# Patient Record
Sex: Male | Born: 1955 | Race: Black or African American | Hispanic: No | Marital: Married | Smoking: Never smoker
Health system: Southern US, Community
[De-identification: ages and names within clinical notes are randomized; demographics above are authoritative.]

## PROBLEM LIST (undated history)

## (undated) DIAGNOSIS — I10 Essential (primary) hypertension: Secondary | ICD-10-CM

---

## 2018-11-04 ENCOUNTER — Telehealth: Payer: Self-pay | Admitting: Family Medicine

## 2018-11-04 ENCOUNTER — Other Ambulatory Visit: Payer: Self-pay

## 2018-11-04 ENCOUNTER — Emergency Department (INDEPENDENT_AMBULATORY_CARE_PROVIDER_SITE_OTHER): Payer: BC Managed Care – PPO

## 2018-11-04 ENCOUNTER — Emergency Department (INDEPENDENT_AMBULATORY_CARE_PROVIDER_SITE_OTHER)
Admission: EM | Admit: 2018-11-04 | Discharge: 2018-11-04 | Disposition: A | Discharge status: Home / Self Care | Payer: BC Managed Care – PPO | Source: Home / Self Care | Attending: Family Medicine | Admitting: Family Medicine

## 2018-11-04 DIAGNOSIS — R05 Cough: Secondary | ICD-10-CM

## 2018-11-04 DIAGNOSIS — R053 Chronic cough: Secondary | ICD-10-CM

## 2018-11-04 HISTORY — DX: Essential (primary) hypertension: I10

## 2018-11-04 MED ORDER — AZITHROMYCIN 250 MG PO TABS: ORAL_TABLET | ORAL | 0 refills | Status: DC

## 2018-11-04 MED ORDER — PREDNISONE 20 MG PO TABS: ORAL_TABLET | ORAL | 0 refills | Status: DC

## 2018-11-04 NOTE — ED Provider Notes (Signed)
[name]    CSN: [id number] Arrival date & time: 11/04/18  1305     History   Chief Complaint Chief Complaint  Patient presents with  . Cough  . Sore Throat    HPI Eric Hardin is a 63 y.o. male.   One month ago patient developed a URI with sore throat, nasal congestion, and cough.  He improved, but continued to have a cough.  One week ago he developed chills, and during the past week has had increased cough, fatigue, and occasional wheezing.  He feels tightness in his anterior chest. Patient had pneumonia about two years ago.  The history is provided by the patient.    Past Medical History:  Diagnosis Date  . Hypertension   Pneumonia  Active problem: hypertension    Past surgical history:  No entries by patient     Home Medications    Prior to Admission medications   Medication Sig Start Date End Date Taking? Authorizing Provider  acetaminophen (TYLENOL) 325 MG tablet Take 650 mg by mouth every 6 (six) hours as needed.   Yes [provider]  guaiFENesin (MUCINEX) 600 MG 12 hr tablet Take by mouth 2 (two) times daily.   Yes [provider]  hydrochlorothiazide (HYDRODIURIL) 12.5 MG tablet Take 12.5 mg by mouth daily. 08/27/18  Yes [provider]  ibuprofen (ADVIL,MOTRIN) 200 MG tablet Take 200 mg by mouth every 6 (six) hours as needed.   Yes [provider]  losartan (COZAAR) 100 MG tablet Take 100 mg by mouth daily. 06/26/18  Yes [provider]  azithromycin (ZITHROMAX Z-PAK) 250 MG tablet Take 2 tabs today; then begin one tab once daily for 4 more days. 11/04/18   Kandra Nicolas, MD  predniSONE (DELTASONE) 20 MG tablet Take one tab by mouth twice daily for 4 days, then one daily. Take with food. 11/04/18   Kandra Nicolas, MD    Family History Family History  Problem Relation Age of Onset  . Aneurysm Mother   . Hypertension Mother   . Cancer Father 26       Prostate    Social History Social  History   Tobacco Use  . Smoking status: Never Smoker  . Smokeless tobacco: Never Used  Substance Use Topics  . Alcohol use: Not Currently    Frequency: Never  . Drug use: Never     Allergies   Patient has no known allergies.   Review of Systems Review of Systems + sore throat + cough No pleuritic pain ? wheezing No nasal congestion + post-nasal drainage No sinus pain/pressure No itchy/red eyes No earache No hemoptysis No SOB No fever, + chills No nausea No vomiting No abdominal pain No diarrhea No urinary symptoms No skin rash + fatigue No myalgias No headache Used OTC meds without relief   Physical Exam Triage Vital Signs ED Triage Vitals [11/04/18 1357]  Enc Vitals Group     BP 117/78     Pulse Rate 86     Resp 18     Temp 98.2 F (36.8 C)     Temp Source Oral     SpO2 99 %     Weight 183 lb (83 kg)     Height 5\' 9"  (1.753 m)     Head Circumference      Peak Flow      Pain Score 0     Pain Loc      Pain Edu?  Excl. in [place]?    No data found.  Updated Vital Signs BP 117/78 (BP Location: Right Arm)   Pulse 86   Temp 98.2 F (36.8 C) (Oral)   Resp 18   Ht 5\' 9"  (1.753 m)   Wt 83 kg   SpO2 99%   BMI 27.02 kg/m   Visual Acuity Right Eye Distance:   Left Eye Distance:   Bilateral Distance:    Right Eye Near:   Left Eye Near:    Bilateral Near:     Physical Exam Nursing notes and Vital Signs reviewed. Appearance:  Patient appears stated age, and in no acute distress Eyes:  Pupils are equal, round, and reactive to light and accomodation.  Extraocular movement is intact.  Conjunctivae are not inflamed  Ears:  Canals normal.  Tympanic membranes normal.  Nose:  Mildly congested turbinates.  No sinus tenderness.   Pharynx:  Normal Neck:  Supple. No adenopathy. Lungs:  Clear to auscultation, but has a frequent course cough.  Breath sounds are equal.  Moving air well. Heart:  Regular rate and rhythm without murmurs, rubs, or gallops.   Abdomen:  Nontender without masses or hepatosplenomegaly.  Bowel sounds are present.  No CVA or flank tenderness.  Extremities:  No edema.  Skin:  No rash present.    UC Treatments / Results  Labs (all labs ordered are listed, but only abnormal results are displayed) Labs Reviewed - No data to display  EKG None  Radiology Dg Chest 2 View  Result Date: 11/04/2018 CLINICAL DATA:  Pt c/o productive cough x 1 month which has worsened over the past 5 days with diaphoresis. Hx of htn. EXAM: CHEST - 2 VIEW COMPARISON:  None. FINDINGS: The heart size and mediastinal contours are within normal limits. Both lungs are clear. No pleural effusion or pneumothorax. The visualized skeletal structures are unremarkable. IMPRESSION: No active cardiopulmonary disease. Electronically Signed   By: Lajean Manes M.D.   On: 11/04/2018 15:18    Procedures Procedures (including critical care time)  Medications Ordered in UC Medications - No data to display  Initial Impression / Assessment and Plan / UC Course  I have reviewed the triage vital signs and the nursing notes.  Pertinent labs & imaging results that were available during my care of the patient were reviewed by me and considered in my medical decision making (see chart for details).    Will treat as a bacterial bronchitis with Z-pak.  Begin prednisone burst/taper.  Followup with Family Doctor if not improved in about 8 days.   Final Clinical Impressions(s) / UC Diagnoses   Final diagnoses:  Persistent cough for 3 weeks or longer     Discharge Instructions     Take plain guaifenesin (1200mg  extended release tabs such as Mucinex) twice daily, with plenty of water, for cough and congestion.  Get adequate rest.   May take Delsym Cough Suppressant at bedtime for nighttime cough.  Try warm salt water gargles for sore throat.  Stop all antihistamines for now, and other non-prescription cough/cold preparations.      ED Prescriptions     Medication Sig Dispense Auth. Provider   azithromycin (ZITHROMAX Z-PAK) 250 MG tablet Take 2 tabs today; then begin one tab once daily for 4 more days. 6 tablet Kandra Nicolas, MD   predniSONE (DELTASONE) 20 MG tablet Take one tab by mouth twice daily for 4 days, then one daily. Take with food. 12 tablet Kandra Nicolas, MD  Kandra Nicolas, MD 11/05/18 [phone]

## 2018-11-04 NOTE — ED Triage Notes (Signed)
Eric Hardin complains of a productive cough with yellow sputum for a month. He recently has noticed a sore throat, diarrhea, chills and chest pain from coughing.

## 2018-11-04 NOTE — Discharge Instructions (Addendum)
Take plain guaifenesin (1200mg  extended release tabs such as Mucinex) twice daily, with plenty of water, for cough and congestion.  Get adequate rest.   May take Delsym Cough Suppressant at bedtime for nighttime cough.  Try warm salt water gargles for sore throat.  Stop all antihistamines for now, and other non-prescription cough/cold preparations.

## 2020-03-10 ENCOUNTER — Other Ambulatory Visit: Payer: Self-pay

## 2020-03-10 ENCOUNTER — Ambulatory Visit (INDEPENDENT_AMBULATORY_CARE_PROVIDER_SITE_OTHER): Payer: BC Managed Care – PPO

## 2020-03-10 ENCOUNTER — Encounter: Payer: Self-pay | Admitting: Family Medicine

## 2020-03-10 ENCOUNTER — Ambulatory Visit (INDEPENDENT_AMBULATORY_CARE_PROVIDER_SITE_OTHER): Payer: BC Managed Care – PPO | Admitting: Family Medicine

## 2020-03-10 ENCOUNTER — Ambulatory Visit: Payer: Self-pay

## 2020-03-10 VITALS — BP 120/78 | HR 69 | Ht 69.0 in | Wt 196.0 lb

## 2020-03-10 DIAGNOSIS — M25531 Pain in right wrist: Secondary | ICD-10-CM

## 2020-03-10 DIAGNOSIS — M25562 Pain in left knee: Secondary | ICD-10-CM

## 2020-03-10 NOTE — Progress Notes (Signed)
Subjective:    CC: L knee and R elbow pain  I, Molly Weber, LAT, ATC, am serving as scribe for Dr. [name].  HPI: Pt is a 64 y/o male presenting w/ c/o L knee and elbow pain.  L knee pain: Pain x 3 months w/ no known MOI.  He locates his pain to his L ant knee. -Swelling: No -Mechanical symptoms: Yes -Aggravating factors: Sitting; rest -Treatments tried: Voltaren gel; Tylenol  R Elbow pain: Pain x 2 months that radiates from his R elbow to his R thumb. -Swelling: No -Numbness/tingling: yes in his R forearm and hand especially at night -Weakness: No -Aggravating factors: gripping; R hand/arm active motion -Treatments tried: Voltaren gel; Tylenol  Pertinent review of Systems: No fevers or chills  Relevant historical information: Primary care provider is in [place].  No significant medical problems.   Objective:    Vitals:   03/10/20 1025  BP: 120/78  Pulse: 69  SpO2: 99%   General: Well Developed, well nourished, and in no acute distress.   MSK:  Left knee normal-appearing Range of motion 0-120 degrees with mild crepitation. Nontender. Stable ligamentous exam. Intact strength. Negative Murray's test.  Right elbow normal-appearing.  Normal motion. Tender palpation lateral epicondyle and it biceps tendon insertion mildly. Normal strength. Some pain with resisted wrist extension at lateral epicondyle. No pain biceps insertion with resisted supination or flexion.  Right wrist normal-appearing nontender normal motion. Negative Tinel's at carpal tunnel. Negative Finkelstein's test. Intact strength.  Lab and Radiology Results  X-ray images left knee obtained today personally independently reviewed Mild DJD.  No acute fractures. Await formal radiology review  Diagnostic Limited MSK Ultrasound of:  Right wrist and elbow Enlarged median nerve at carpal tunnel.  No other significant abnormalities present volar dorsal wrist. Anterior elbow  normal-appearing.  Median nerve visualized.  Pressure overlying median nerve does cause some paresthesias into the distal hand.  Impression: Possible median nerve irritation at elbow or wrist.  Diagnostic Limited MSK Ultrasound of: Left knee Quad tendon normal-appearing Patellar tendon normal. Slight narrowing medial and lateral joint lines but no visible significant meniscus tear. Impression: DJD     Impression and Recommendations:    Assessment and Plan: 64 y.o. male with left knee pain.  Exacerbation of DJD.  Discussed options.  Plan for Voltaren gel and trial of physical therapy.  Patient declined offered steroid injection today.  Recheck back in about 4 to 6 weeks.  Right elbow and wrist pain.  Unclear etiology.  Possible overuse tendinopathy versus nerve injury.  Plan for limited physical therapy.  If not better would proceed with nerve conduction study as next step.Marland Kitchen  PDMP not reviewed this encounter. Orders Placed This Encounter  Procedures  . [place] LIMITED JOINT SPACE STRUCTURES LOW LEFT(NO LINKED CHARGES)    Order Specific Question:   Reason for Exam (SYMPTOM  OR DIAGNOSIS REQUIRED)    Answer:   L knee pain    Order Specific Question:   Preferred imaging location?    Answer:   [facility]  . DG Knee AP/LAT W/[place] Left    Standing Status:   Future    Standing Expiration Date:   03/10/2021    Order Specific Question:   Reason for Exam (SYMPTOM  OR DIAGNOSIS REQUIRED)    Answer:   eval left knee    Order Specific Question:   Preferred imaging location?    Answer:   [facility]    Order Specific  Question:   Radiology Contrast Protocol - do NOT remove file path    Answer:   \\[email or web address]  . Ambulatory referral to Physical Therapy    Referral Priority:   Routine    Referral Type:   Physical Medicine    Referral Reason:   Specialty Services Required    Requested Specialty:   Physical Therapy     Number of Visits Requested:   1   No orders of the defined types were placed in this encounter.   Discussed warning signs or symptoms. Please see discharge instructions. Patient expresses understanding.   The above documentation has been reviewed and is accurate and complete [name], M.D.

## 2020-03-10 NOTE — Patient Instructions (Signed)
Thank you for coming in today.  OK to continue voltaren gel.  Plan for PT at [facility] in [place]. Get xray today.  Recheck in 4-6 weeks.  Could do more if needed.

## 2020-03-11 NOTE — Progress Notes (Signed)
Left knee x-ray looks pretty normal.  A little bit arthritis is present but otherwise no severe changes.

## 2020-03-30 ENCOUNTER — Other Ambulatory Visit: Payer: BC Managed Care – PPO

## 2020-04-07 ENCOUNTER — Other Ambulatory Visit: Payer: Self-pay

## 2020-04-07 ENCOUNTER — Ambulatory Visit (INDEPENDENT_AMBULATORY_CARE_PROVIDER_SITE_OTHER): Payer: BC Managed Care – PPO | Admitting: Family Medicine

## 2020-04-07 ENCOUNTER — Ambulatory Visit (INDEPENDENT_AMBULATORY_CARE_PROVIDER_SITE_OTHER): Payer: BC Managed Care – PPO

## 2020-04-07 ENCOUNTER — Encounter: Payer: Self-pay | Admitting: Family Medicine

## 2020-04-07 VITALS — BP 120/78 | HR 73 | Ht 69.0 in | Wt 195.0 lb

## 2020-04-07 DIAGNOSIS — M7711 Lateral epicondylitis, right elbow: Secondary | ICD-10-CM | POA: Diagnosis not present

## 2020-04-07 DIAGNOSIS — M545 Low back pain, unspecified: Secondary | ICD-10-CM

## 2020-04-07 NOTE — Progress Notes (Signed)
I, Wendy Poet, LAT, ATC, am serving as scribe for Dr. [name].  Eric Hardin is a 64 y.o. male who presents to [place] at [facility] today for f/u of L knee and R medial elbow pain.  He was last seen by Dr. Georgina Snell on 03/10/20 and was referred to outpatient PT at [facility] and was advised to use Voltaren gel. Since his last visit, pt reports that his R elbow is better in the sense that it's not hurting continuously.  He notes that his L knee is better too and only hurts when he's doing certain yoga poses.  He con't to get a pulling-type pain in his R lateral forearm.  He is also c/o about his low back.  Pain located right low back worse with activity.  He denies any radiating pain weakness or numbness distally.  He notes the elbow pain has shifted to the lateral aspect of his elbow and is worse with activity.  The medial elbow pain has resolved.  Lumbar spine pain has been ongoing for months worsening recently.  Diagnostic testing: L knee XR- 03/10/20  Pertinent review of systems: No fevers or chills  Relevant historical information: Hypertension.  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   Exam:  BP 120/78 (BP Location: Left Arm, Patient Position: Sitting, Cuff Size: Normal)   Pulse 73   Ht 5\' 9"  (1.753 m)   Wt 195 lb (88.5 kg)   SpO2 99%   BMI 28.80 kg/m  General: Well Developed, well nourished, and in no acute distress.   MSK:  Right elbow normal-appearing nontender normal motion.  Pain at lateral elbow with resisted wrist extension.  No pain with flexion.  Grip strength intact.  L-spine: Normal-appearing nontender midline.  Tender palpation right lumbar paraspinal musculature. Normal lumbar motion to extension rotation lateral flexion.  Limited flexion. Lower extremity strength reflexes and sensation are intact distally. Negative slump test bilaterally.  Left knee normal-appearing  nontender normal motion normal strength.     Lab and Radiology Results  DG Lumbar Spine 2-3 Views  Result Date: 04/07/2020 CLINICAL DATA:  Low back pain. EXAM: LUMBAR SPINE - 2-3 VIEW COMPARISON:  No prior. FINDINGS: Scratch L3-L4 disc degeneration endplate osteophyte formation. No acute bony abnormality identified. No evidence of fracture. IMPRESSION: L3-L4 degenerative change. No acute abnormality identified. No evidence of fracture or dislocation. Electronically Signed   By: Marcello Moores  Register   On: 04/07/2020 08:29   I, [name], personally (independently) visualized and performed the interpretation of the images attached in this note.     Assessment and Plan: 64 y.o. male with left knee pain resolved with physical therapy and Voltaren gel.  Watchful waiting.  Elbow pain: Medial elbow pain has morphed to lateral elbow pain now and is more consistent with lateral epicondylitis.  Improving with physical therapy.  Plan to continue PT and check back in 6 weeks.  Low back pain: Right low back pain exacerbation of chronic pain.  Likely musculoskeletal strain.  X-ray today shows DJD.Marland Kitchen  Physical therapy should be quite helpful.  Check back in 6 weeks.   PDMP not reviewed this encounter. Orders Placed This Encounter  Procedures  . DG Lumbar Spine 2-3 Views    Standing Status:   Future    Standing Expiration Date:   04/07/2021    Order Specific Question:   Reason for Exam (SYMPTOM  OR DIAGNOSIS REQUIRED)  Answer:   eval right low back pain    Order Specific Question:   Preferred imaging location?    Answer:   [facility]    Order Specific Question:   Radiology Contrast Protocol - do NOT remove file path    Answer:   \\[email or web address]  . Ambulatory referral to Physical Therapy    Referral Priority:   Routine    Referral Type:   Physical Medicine    Referral Reason:   Specialty Services Required    Requested Specialty:   Physical  Therapy    Number of Visits Requested:   1   No orders of the defined types were placed in this encounter.    Discussed warning signs or symptoms. Please see discharge instructions. Patient expresses understanding.   The above documentation has been reviewed and is accurate and complete [name], M.D.

## 2020-04-07 NOTE — Patient Instructions (Addendum)
Thank you for coming in today.  Plan to add on back pain to PT.  Get xray today.  Recheck in 6 weeks.  Let me know sooner if not doing well.

## 2020-04-07 NOTE — Progress Notes (Signed)
X-ray lumbar spine shows some arthritis or degenerative changes at L3-L4 level.  This could be source of pain along with muscle dysfunction.  Physical therapy is a good first step.

## 2020-05-19 ENCOUNTER — Encounter: Payer: Self-pay | Admitting: Family Medicine

## 2020-05-19 ENCOUNTER — Ambulatory Visit (INDEPENDENT_AMBULATORY_CARE_PROVIDER_SITE_OTHER): Payer: BC Managed Care – PPO | Admitting: Family Medicine

## 2020-05-19 ENCOUNTER — Other Ambulatory Visit: Payer: Self-pay

## 2020-05-19 VITALS — BP 110/82 | HR 66 | Ht 69.0 in | Wt 193.0 lb

## 2020-05-19 DIAGNOSIS — R202 Paresthesia of skin: Secondary | ICD-10-CM

## 2020-05-19 DIAGNOSIS — I10 Essential (primary) hypertension: Secondary | ICD-10-CM | POA: Insufficient documentation

## 2020-05-19 DIAGNOSIS — M545 Low back pain, unspecified: Secondary | ICD-10-CM

## 2020-05-19 DIAGNOSIS — M7711 Lateral epicondylitis, right elbow: Secondary | ICD-10-CM

## 2020-05-19 NOTE — Patient Instructions (Addendum)
Thank you for coming in today.  I think the majority of the elbow pain is tennis elbow.   The numbness into the thumb could be a pinched nerve in your neck or carpal tunnel syndrome or something more rare with your nerve.   If this continues or worsens let me know and I will arrange for a nerve conduction study.   Recheck as needed or in 6 weeks.   I recommend you obtained a compression sleeve to help with your joint problems. There are many options on the market however I recommend obtaining a Full elbow Body Helix compression sleeve.  You can find information (including how to appropriate measure yourself for sizing) can be found at www.Body http://www.lambert.com/.  Many of these products are health savings account (HSA) eligible.   You can use the compression sleeve at any time throughout the day but is most important to use while being active as well as for 2 hours post-activity.   It is appropriate to ice following activity with the compression sleeve in place.

## 2020-05-19 NOTE — Progress Notes (Signed)
   Rito Ehrlich, am serving as a Education administrator for Dr. [name].  Eric Hardin is a 64 y.o. male who presents to [place] at [facility] today for f/u of low back pain and R elbow pain.  He was last seen by Dr. Georgina Snell on 04/07/20 and was advised to con't PT at [facility] and his LBP was added to the order.  Since his last visit, pt reports back is good and elbow is better but still not all better. Is doing PT for the elbow 8-10 session.   He also notes some numbness and tingling into his right thumb..  Diagnostic testing: L-spine XR- 04/07/20   Pertinent review of systems: No fevers or chills  Relevant historical information: Hypertension   Exam:  BP 110/82 (BP Location: Left Arm, Patient Position: Sitting, Cuff Size: Normal)   Pulse 66   Ht 5\' 9"  (1.753 m)   Wt 193 lb (87.5 kg)   SpO2 99%   BMI 28.50 kg/m  General: Well Developed, well nourished, and in no acute distress.   MSK: C-spine normal-appearing normal motion. Upper extremity strength intact. Negative Spurling's test.  Right elbow normal-appearing normal motion. Tender palpation lateral epicondyle.  Normal elbow strength. Pain with resisted finger and wrist extension located at lateral epicondyle.  Right hand normal-appearing nontender slight decreased sensation to light touch thumb. Negative Tinel's at carpal tunnel.     Assessment and Plan: 64 y.o. male with back pain significantly improved with physical therapy.  Watchful waiting.  Right elbow pain: Multifactorial.  Patient likely has lateral epicondylitis.  Plan to continue home exercise program and counterforce brace/body helix brace.  Thumb paresthesia: Could be cervical radicular or carpal tunnel syndrome.  If not improving with time patient will notify me we will proceed with nerve conduction study.  Recheck as needed or in 6 weeks.    Discussed warning signs or symptoms. Please see discharge instructions. Patient expresses  understanding.   The above documentation has been reviewed and is accurate and complete [name], M.D.

## 2020-07-01 ENCOUNTER — Ambulatory Visit: Payer: Self-pay

## 2020-07-01 ENCOUNTER — Encounter: Payer: Self-pay | Admitting: Family Medicine

## 2020-07-01 ENCOUNTER — Ambulatory Visit (INDEPENDENT_AMBULATORY_CARE_PROVIDER_SITE_OTHER): Payer: BC Managed Care – PPO | Admitting: Family Medicine

## 2020-07-01 ENCOUNTER — Other Ambulatory Visit: Payer: Self-pay

## 2020-07-01 VITALS — BP 112/84 | HR 63 | Ht 69.0 in | Wt 186.0 lb

## 2020-07-01 DIAGNOSIS — M7711 Lateral epicondylitis, right elbow: Secondary | ICD-10-CM

## 2020-07-01 NOTE — Progress Notes (Signed)
   I, Wendy Poet, LAT, ATC, am serving as scribe for Dr. [name].  Eric Hardin is a 64 y.o. male who presents to [place] at [facility] today for f/u of R elbow pain.  Elbow pain was thought to be lateral epicondylitis and possible distal biceps tendinitis.  He was last seen by Dr. Georgina Snell on 05/19/20 and was advised to con't PT at [facility].  He was also advised to wear an elbow compression sleeve and/or counterforce strap.  Since his last visit, pt reports that his R elbow is a little better but con't to bother him specifically when he's resting/sitting still.  It also bothers him w/ lifting.  He also con't to have R thumb numbness.  He has been wearing an elbow compression sleeve mainly when he plays golf.   Pertinent review of systems: No fevers or chills  Relevant historical information: Hypertension   Exam:  BP 112/84 (BP Location: Left Arm, Patient Position: Sitting, Cuff Size: Normal)   Pulse 63   Ht 5\' 9"  (1.753 m)   Wt 186 lb (84.4 kg)   SpO2 99%   BMI 27.47 kg/m  General: Well Developed, well nourished, and in no acute distress.   MSK: Right elbow normal-appearing normal motion. Mildly tender palpation lateral epicondyle and distal biceps insertion on the radius. Normal elbow strength. Some pain felt in elbow with resisted wrist and finger extension and wrist supination.  Right hand and wrist normal-appearing nontender normal motion normal strength.  Mildly positive Tinel's at carpal tunnel.      Assessment and Plan: 64 y.o. male with mild right elbow pain thought to be lateral epicondylitis and distal biceps tendinitis mildly.  Has done pretty well with general physical therapy and home exercise and compression.  He is pretty happy with how things are going and would like to proceed with watchful waiting.  Next step if needed would be either dedicated hand physical therapy or MRI.  Will need x-ray prior to MRI.  As for the thumb numbness  carpal tunnel syndrome most likely.  Again same watchful waiting.    Discussed warning signs or symptoms. Please see discharge instructions. Patient expresses understanding.   The above documentation has been reviewed and is accurate and complete [name], M.D.  Total encounter time 20 minutes including face-to-face time with the patient and, reviewing past medical record, and charting on the date of service.   Discussed plan and options.

## 2020-07-01 NOTE — Patient Instructions (Signed)
Thank you for coming in today.  We will keep this open ended.  Continue your home exercises and compression with activity as tolerated.   If not improving or if worsening next step is probably hand therapy and or MRI.  Will need xray of the elbow prior to MRI usually.   Keep me updated.  Can do more if needed.

## 2020-08-12 ENCOUNTER — Encounter: Payer: Self-pay | Admitting: Family Medicine

## 2020-08-18 ENCOUNTER — Other Ambulatory Visit: Payer: Self-pay

## 2020-08-18 ENCOUNTER — Ambulatory Visit (INDEPENDENT_AMBULATORY_CARE_PROVIDER_SITE_OTHER): Payer: BC Managed Care – PPO | Admitting: Family Medicine

## 2020-08-18 VITALS — BP 120/84 | HR 102 | Ht 69.0 in | Wt 187.4 lb

## 2020-08-18 DIAGNOSIS — M25562 Pain in left knee: Secondary | ICD-10-CM | POA: Diagnosis not present

## 2020-08-18 NOTE — Progress Notes (Signed)
   I, [name], LAT, ATC acting as a scribe for Eric Leader, MD.  Eric Hardin is a 64 y.o. male who presents to [place] at [facility] today for chronic L knee pn. Pt was last by Dr. Georgina Snell for this pathology on 03/10/20 and was advised the pn is secondary to exacerbation of DJD, planned for Voltaren gel and trial PT, of which pt completed 0 visit.  Pt declined offered steroid injection. Today, pt locates pn all over L knee. MOI: Pt hasn't stopped/imporved. He's like to discuss getting L knee MRI. Rx tried: Voltaren gel, IBU, Tylenol.   Dx imaging: 04/07/20 L-spine MRI          03/10/20 L knee XR  Pertinent review of systems: No fevers or chills  Relevant historical information: Hypertension.  Right knee meniscus tear.   Exam:  BP 120/84 (BP Location: Right Arm, Patient Position: Sitting, Cuff Size: Normal)   Pulse (!) 102   Ht 5\' 9"  (1.753 m)   Wt 187 lb 6.4 oz (85 kg)   SpO2 98%   BMI 27.67 kg/m  General: Well Developed, well nourished, and in no acute distress.   MSK: Left knee normal-appearing normal motion without crepitation.  Tender palpation medial joint line. Stable ligamentous exam. Positive medial McMurray's test. Intact strength.    Lab and Radiology Results EXAM: LEFT KNEE 3 VIEWS  COMPARISON:  None.  FINDINGS: No visible joint effusion. No joint space narrowing at either weight-bearing compartment or the patellofemoral joint. No marginal osteophytes at either weight-bearing compartment. Tiny osteophyte at the inferior patella. No focal bone lesion.  IMPRESSION: Normal radiographs for age. No evidence of joint effusion or any significant degenerative finding by radiography.   Electronically Signed   By: Nelson Chimes M.D.   On: 03/11/2020 08:13 I, Eric Hardin, personally (independently) visualized and performed the interpretation of the images attached in this note.     Assessment and Plan: 64 y.o. male with right knee  pain thought to be due to medial meniscus tear.  Patient did not have severe DJD on prior x-ray.  At this point patient willing to proceed with surgery if needed.  Will obtain MRI to further characterize knee pain and for potential surgical or injection planning.  Recheck after MRI.   PDMP not reviewed this encounter. Orders Placed This Encounter  Procedures  . MR Knee Left  Wo Contrast    Standing Status:   Future    Standing Expiration Date:   08/18/2021    Order Specific Question:   What is the patient's sedation requirement?    Answer:   No Sedation    Order Specific Question:   Does the patient have a pacemaker or implanted devices?    Answer:   No    Order Specific Question:   Preferred imaging location?    Answer:   [facility] (table limit-550lbs)   No orders of the defined types were placed in this encounter.    Discussed warning signs or symptoms. Please see discharge instructions. Patient expresses understanding.   The above documentation has been reviewed and is accurate and complete Eric Hardin, M.D.

## 2020-08-18 NOTE — Patient Instructions (Signed)
Thank you for coming in today.  Plan for MRI.  You should hear soon about scheduling.  Recheck after MRI to go over results and discuss plan.

## 2020-09-07 ENCOUNTER — Ambulatory Visit
Admission: RE | Admit: 2020-09-07 | Discharge: 2020-09-07 | Disposition: A | Discharge status: Home / Self Care | Payer: BC Managed Care – PPO | Source: Ambulatory Visit | Attending: Family Medicine | Admitting: Family Medicine

## 2020-09-07 ENCOUNTER — Other Ambulatory Visit: Payer: Self-pay

## 2020-09-07 DIAGNOSIS — M25562 Pain in left knee: Secondary | ICD-10-CM

## 2020-09-07 NOTE — Progress Notes (Signed)
MRI of the knee shows a tear of the medial meniscus likely suspected.  Additionally there is a little bit of fluid collecting at the inside part of the knee near the hamstring tendon.  This could be causing some of the pain as well.  Recommend return to clinic to discuss results in full detail.  This may improve with steroid injection however that is debatable.

## 2020-09-08 NOTE — Progress Notes (Signed)
I, Wendy Poet, LAT, ATC, am serving as scribe for Dr. [name].  Eric Hardin is a 64 y.o. male who presents to [place] at [facility] today for f/u of L knee pain and L knee MRI review.  He was last seen by Dr. Georgina Snell on 08/18/20 w/ no change noted in his symptoms and was referred for an MRI.  Since his last visit, pt reports no change in his symptoms and states that it "hurts all the time."  Diagnostic testing: L knee MRI- 09/07/20; L knee XR- 03/10/20  Pertinent review of systems: No fevers or chills  Relevant historical information: Hypertension   Exam:  BP 120/82 (BP Location: Left Arm, Patient Position: Sitting, Cuff Size: Normal)   Pulse 74   Ht 5\' 9"  (1.753 m)   Wt 186 lb 3.2 oz (84.5 kg)   SpO2 99%   BMI 27.50 kg/m  General: Well Developed, well nourished, and in no acute distress.   MSK: Left knee no significant effusion. Mildly tender palpation medial joint line. Normal motion. Stable ligamentous exam.    Lab and Radiology Results No results found for this or any previous visit (from the past 72 hour(s)). MR Knee Left  Wo Contrast  Result Date: 09/07/2020 CLINICAL DATA:  Left knee pain for 1 year.  No known injury EXAM: MRI OF THE LEFT KNEE WITHOUT CONTRAST TECHNIQUE: Multiplanar, multisequence MR imaging of the knee was performed. No intravenous contrast was administered. COMPARISON:  X-ray 03/10/2020 FINDINGS: MENISCI Medial meniscus: Irregular tear of the medial meniscal body with radial component (series 8, image 25; series 3, image 21). Lateral meniscus:  Intact. LIGAMENTS Cruciates:  Intact ACL and PCL. Collaterals: Medial collateral ligament is intact. Lateral collateral ligament complex is intact. CARTILAGE Patellofemoral:  No chondral defect. Medial:  No chondral defect. Lateral:  No chondral defect. Joint: Physiologic amount of joint fluid. Fat pads within normal limits. Popliteal Fossa:  No Baker cyst. Intact popliteus tendon.  Extensor Mechanism:  Intact quadriceps tendon and patellar tendon. Bones: No focal marrow signal abnormality. No fracture or dislocation. Other: Small amount of lobulated fluid in the region of the semimembranosus-tibial collateral ligament bursa. IMPRESSION: 1. Irregular tear of the medial meniscal body with radial component. 2. Mild semimembranosus-tibial collateral ligament bursitis. Electronically Signed   By: Davina Poke D.O.   On: 09/07/2020 10:26  I, [name], personally (independently) visualized and performed the interpretation of the images attached in this note.   Procedure: Real-time Ultrasound Guided Injection of left knee superior patellar space lateral line Device: Philips Affiniti 50G Images permanently stored and available for review in PACS Verbal informed consent obtained.  Discussed risks and benefits of procedure. Warned about infection bleeding damage to structures skin hypopigmentation and fat atrophy among others. Patient expresses understanding and agreement Time-out conducted.   Noted no overlying erythema, induration, or other signs of local infection.   Skin prepped in a sterile fashion.   Local anesthesia: Topical Ethyl chloride.   With sterile technique and under real time ultrasound guidance:  40 mg of Kenalog and 2 mL of Marcaine injected into joint. Fluid seen entering the joint capsule.   Completed without difficulty   Pain immediately resolved suggesting accurate placement of the medication.   Advised to call if fevers/chills, erythema, induration, drainage, or persistent bleeding.   Images permanently stored and available for review in the ultrasound unit.  Impression: Technically successful ultrasound guided injection.     Assessment and Plan: 64 y.o. male  with left knee pain due to medial meniscus tear with resulting nearby bursitis.  He does not have severe mechanical symptoms.  We discussed options.  Given his lack of significant mechanical  symptoms reasonable to try steroid injection.  If does not provide significant benefit would refer to orthopedic surgery for surgical evaluation.  Check back as needed.    Orders Placed This Encounter  Procedures  . [place] LIMITED JOINT SPACE STRUCTURES LOW LEFT(NO LINKED CHARGES)    Order Specific Question:   Reason for Exam (SYMPTOM  OR DIAGNOSIS REQUIRED)    Answer:   L knee pain    Order Specific Question:   Preferred imaging location?    Answer:   [facility]   No orders of the defined types were placed in this encounter.    Discussed warning signs or symptoms. Please see discharge instructions. Patient expresses understanding.   The above documentation has been reviewed and is accurate and complete [name], M.D.

## 2020-09-09 ENCOUNTER — Ambulatory Visit: Payer: Self-pay

## 2020-09-09 ENCOUNTER — Encounter: Payer: Self-pay | Admitting: Family Medicine

## 2020-09-09 ENCOUNTER — Other Ambulatory Visit: Payer: Self-pay

## 2020-09-09 ENCOUNTER — Ambulatory Visit (INDEPENDENT_AMBULATORY_CARE_PROVIDER_SITE_OTHER): Payer: BC Managed Care – PPO | Admitting: Family Medicine

## 2020-09-09 VITALS — BP 120/82 | HR 74 | Ht 69.0 in | Wt 186.2 lb

## 2020-09-09 DIAGNOSIS — M25562 Pain in left knee: Secondary | ICD-10-CM

## 2020-09-09 NOTE — Patient Instructions (Signed)
Thank you for coming in today.  Call or go to the ER if you develop a large red swollen joint with extreme pain or oozing puss.   I recommend you obtained a compression sleeve to help with your joint problems. There are many options on the market however I recommend obtaining a full knee Body Helix compression sleeve.  You can find information (including how to appropriate measure yourself for sizing) can be found at www.Body http://www.lambert.com/.  Many of these products are health savings account (HSA) eligible.   You can use the compression sleeve at any time throughout the day but is most important to use while being active as well as for 2 hours post-activity.   It is appropriate to ice following activity with the compression sleeve in place.  Please use voltaren gel up to 4x daily for pain as needed.   If not better consider surgery referral let me know.

## 2021-04-16 ENCOUNTER — Other Ambulatory Visit (HOSPITAL_COMMUNITY): Payer: Self-pay

## 2021-06-17 ENCOUNTER — Other Ambulatory Visit: Payer: Self-pay

## 2021-06-17 ENCOUNTER — Encounter: Payer: Self-pay | Admitting: *Deleted

## 2021-06-17 ENCOUNTER — Ambulatory Visit
Admission: EM | Admit: 2021-06-17 | Discharge: 2021-06-17 | Disposition: A | Discharge status: Home / Self Care | Payer: BC Managed Care – PPO | Attending: Emergency Medicine | Admitting: Emergency Medicine

## 2021-06-17 DIAGNOSIS — U071 COVID-19: Secondary | ICD-10-CM

## 2021-06-17 MED ORDER — CHLORHEXIDINE GLUCONATE 0.12 % MT SOLN: 15.0000 mL | Freq: Two times a day (BID) | OROMUCOSAL | 0 refills | Status: DC

## 2021-06-17 MED ORDER — MOLNUPIRAVIR 200 MG PO CAPS: 4.0000 | ORAL_CAPSULE | Freq: Two times a day (BID) | ORAL | 0 refills | Status: AC

## 2021-06-17 NOTE — ED Triage Notes (Signed)
Pt had a Positive Home COVID test on WED. Pt Sx's are cough,runny nose ,sore throat,diarrhea and ABD pain.

## 2021-06-17 NOTE — Discharge Instructions (Signed)
Get plenty of rest and push fluids Antiviral medication prescribed Use OTC zyrtec for nasal congestion, runny nose, and/or sore throat Use OTC flonase for nasal congestion and runny nose Use medications daily for symptom relief Use OTC medications like ibuprofen or tylenol as needed fever or pain Call or go to the ED if you have any new or worsening symptoms such as fever, worsening cough, shortness of breath, chest tightness, chest pain, turning blue, changes in mental status, etc..Marland Kitchen

## 2021-06-17 NOTE — ED Provider Notes (Signed)
[place]   [id number] 06/17/21 Arrival Time: 11   CC: COVID symptoms  SUBJECTIVE: History from: patient.  Eric Hardin is a 65 y.o. male who presents with cough, runny nose, sore throat, diarrhea and abdominal discomfort x 1 day.  Tested positive for covid at home.  Denies alleviating or aggravating factors.  Denies previous symptoms in the past.   Denies fever, chills, SOB, wheezing, chest pain, nausea, changes in bowel or bladder habits.     ROS: As per HPI.  All other pertinent ROS negative.     Past Medical History:  Diagnosis Date   Hypertension    History reviewed. No pertinent surgical history. No Known Allergies No current facility-administered medications on file prior to encounter.   Current Outpatient Medications on File Prior to Encounter  Medication Sig Dispense Refill   acetaminophen (TYLENOL) 325 MG tablet Take 650 mg by mouth every 6 (six) hours as needed.     cholecalciferol (VITAMIN D3) 25 MCG (1000 UNIT) tablet Take 2,000 Units by mouth daily.     guaiFENesin (MUCINEX) 600 MG 12 hr tablet Take by mouth 2 (two) times daily.      hydrochlorothiazide (HYDRODIURIL) 12.5 MG tablet Take 12.5 mg by mouth daily.     ibuprofen (ADVIL,MOTRIN) 200 MG tablet Take 200 mg by mouth every 6 (six) hours as needed.     losartan (COZAAR) 100 MG tablet Take 100 mg by mouth daily.     Social History   Socioeconomic History   Marital status: Married    Spouse name: Not on file   Number of children: Not on file   Years of education: Not on file   Highest education level: Not on file  Occupational History   Not on file  Tobacco Use   Smoking status: Never   Smokeless tobacco: Never  Vaping Use   Vaping Use: Never used  Substance and Sexual Activity   Alcohol use: Not Currently   Drug use: Never   Sexual activity: Not on file  Other Topics Concern   Not on file  Social History Narrative   Not on file   Social Determinants of Health   Financial  Resource Strain: Not on file  Food Insecurity: Not on file  Transportation Needs: Not on file  Physical Activity: Not on file  Stress: Not on file  Social Connections: Not on file  Intimate Partner Violence: Not on file   Family History  Problem Relation Age of Onset   Aneurysm Mother    Hypertension Mother    Cancer Father 44       Prostate    OBJECTIVE:  Vitals:   06/17/21 1120  BP: (!) 148/81  Pulse: (!) 109  Resp: 18  Temp: 99.5 F (37.5 C)  SpO2: 97%     General appearance: alert; appears fatigued, but nontoxic; speaking in full sentences and tolerating own secretions HEENT: NCAT; Ears: EACs clear, TMs pearly gray; Eyes: PERRL.  EOM grossly intact. Sinuses: nontender; Nose: nares patent without rhinorrhea, Throat: oropharynx clear, tonsils non erythematous or enlarged, uvula midline  Neck: supple without LAD Lungs: unlabored respirations, symmetrical air entry; cough: mild; no respiratory distress; CTAB Heart: regular rate and rhythm.   Skin: warm and dry Psychological: alert and cooperative; normal mood and affect  ASSESSMENT & PLAN:  1. COVID-19 virus infection     Meds ordered this encounter  Medications   molnupiravir EUA (LAGEVRIO) 200 MG CAPS capsule    Sig: Take 4 capsules (800  mg total) by mouth in the morning and at bedtime for 5 days.    Dispense:  40 capsule    Refill:  0    Order Specific Question:   Supervising Provider    Answer:   Raylene Everts [id number]   chlorhexidine (PERIDEX) 0.12 % solution    Sig: Use as directed 15 mLs in the mouth or throat 2 (two) times daily.    Dispense:  473 mL    Refill:  0    Order Specific Question:   Supervising Provider    Answer:   Raylene Everts [id number]    Get plenty of rest and push fluids Antiviral medication prescribed Use OTC zyrtec for nasal congestion, runny nose, and/or sore throat Use OTC flonase for nasal congestion and runny nose Use medications daily for symptom relief Use OTC  medications like ibuprofen or tylenol as needed fever or pain Call or go to the ED if you have any new or worsening symptoms such as fever, worsening cough, shortness of breath, chest tightness, chest pain, turning blue, changes in mental status, etc...   Peridex sent in for sore throat  Reviewed expectations re: course of current medical issues. Questions answered. Outlined signs and symptoms indicating need for more acute intervention. Patient verbalized understanding. After Visit Summary given.          Lestine Box, PA-C 06/17/21 1149

## 2021-07-22 ENCOUNTER — Other Ambulatory Visit: Payer: Self-pay | Admitting: Urology

## 2021-07-22 DIAGNOSIS — C61 Malignant neoplasm of prostate: Secondary | ICD-10-CM

## 2021-08-07 ENCOUNTER — Ambulatory Visit
Admission: RE | Admit: 2021-08-07 | Discharge: 2021-08-07 | Disposition: A | Discharge status: Home / Self Care | Payer: BC Managed Care – PPO | Source: Ambulatory Visit | Attending: Urology | Admitting: Urology

## 2021-08-07 DIAGNOSIS — C61 Malignant neoplasm of prostate: Secondary | ICD-10-CM

## 2021-08-07 MED ORDER — GADOBENATE DIMEGLUMINE 529 MG/ML IV SOLN
10.0000 mL | Freq: Once | INTRAVENOUS | Status: AC | PRN
  Administered 2021-08-07: 10 mL via INTRAVENOUS

## 2021-09-06 ENCOUNTER — Telehealth: Payer: Self-pay | Admitting: Radiation Oncology

## 2021-09-06 NOTE — Telephone Encounter (Signed)
Called patient to schedule consultation with Dr. Tammi Klippel. No answer, LVM for return call.

## 2021-09-07 ENCOUNTER — Telehealth: Payer: Self-pay | Admitting: Radiation Oncology

## 2021-09-07 NOTE — Telephone Encounter (Signed)
Second attempt: Called patient to schedule consultation with Dr. Tammi Klippel. No answer, LVM for return call.

## 2021-09-13 NOTE — Progress Notes (Signed)
GU Location of Tumor / Histology: Prostate Ca  If Prostate Cancer, Gleason Score is (3 + 3) and PSA is (5.28 as of 10/22)  Biopsies  Dr. Lovena Neighbours         Past/Anticipated interventions by urology, if any:   Past/Anticipated interventions by medical oncology, if any:   Weight changes, if any: No  IPSS:  11 SHIM:  16  Bowel/Bladder complaints, if any:  Only after biopsy bleeding.  Nausea/Vomiting, if any: No  Pain issues, if any:  0/10  SAFETY ISSUES: Prior radiation?  No Pacemaker/ICD? No Possible current pregnancy? Male Is the patient on methotrexate?  No  Current Complaints / other details:  Need more information on treatment options.

## 2021-09-14 ENCOUNTER — Ambulatory Visit
Admission: RE | Admit: 2021-09-14 | Discharge: 2021-09-14 | Disposition: A | Discharge status: Home / Self Care | Payer: BC Managed Care – PPO | Source: Ambulatory Visit | Attending: Radiation Oncology | Admitting: Radiation Oncology

## 2021-09-14 ENCOUNTER — Other Ambulatory Visit: Payer: Self-pay

## 2021-09-14 VITALS — BP 141/98 | HR 65 | Temp 96.6°F | Resp 18 | Ht 69.0 in | Wt 183.2 lb

## 2021-09-14 DIAGNOSIS — Z79899 Other long term (current) drug therapy: Secondary | ICD-10-CM | POA: Diagnosis not present

## 2021-09-14 DIAGNOSIS — C61 Malignant neoplasm of prostate: Secondary | ICD-10-CM | POA: Diagnosis not present

## 2021-09-14 DIAGNOSIS — Z8042 Family history of malignant neoplasm of prostate: Secondary | ICD-10-CM | POA: Insufficient documentation

## 2021-09-14 DIAGNOSIS — I10 Essential (primary) hypertension: Secondary | ICD-10-CM | POA: Diagnosis not present

## 2021-09-14 NOTE — Progress Notes (Signed)
Introduced myself to patient as the prostate nurse navigator and discussed my role.  No barriers to care identified at this time.  He is here to discuss his radiation treatment options.  I gave him my business card and asked him to call me with questions or concerns.  Verbalized understanding.   

## 2021-09-14 NOTE — Progress Notes (Signed)
Radiation Oncology         [phone] ________________________________  Initial Outpatient Consultation  Name: Eric Hardin MRN: [id number]  Date: 09/14/2021  DOB: [birth year]  WU:[id number], No Pcp Per (Inactive)  Eric Gourd*   REFERRING PHYSICIAN: Davis Gourd*  DIAGNOSIS: 65 y.o. gentleman with Stage T1c adenocarcinoma of the prostate with Gleason score of 3+3, and PSA of 5.28.    ICD-10-CM   1. Malignant neoplasm of prostate ([place])  C61       HISTORY OF PRESENT ILLNESS: Eric Hardin is a 65 y.o. male with a diagnosis of prostate cancer. He has been followed by Dr. Lovena Neighbours since at least 04/2020 for an elevated PSA, which was 3.54 at that time. He previously underwent prostate biopsy on 05/28/20 showing one small focus of Gleason 3+3 prostate cancer. They opted to proceed with active surveillance and he has continued to be followed closely since that time.  His PSA decreased to 2.71 in March 2022 but at the time of his recent follow up in 07/2021 his PSA had increased to 5.28. He underwent prostate MRI on 08/07/21 showing no evidence of high-grade prostate carcinoma (PI-RADS 2). The patient proceeded to surveillance transrectal ultrasound with 12 biopsies of the prostate on 08/30/21.  The prostate volume measured 31 cc.  Out of 12 core biopsies, 4 were positive, all on the right.  The maximum Gleason score was 3+3, and this was seen in the right base, right mid (small focus), right base lateral, and right apex lateral.  The patient reviewed the biopsy results with his urologist and he has kindly been referred today for discussion of potential radiation treatment options.   PREVIOUS RADIATION THERAPY: No  PAST MEDICAL HISTORY:  Past Medical History:  Diagnosis Date   Hypertension       PAST SURGICAL HISTORY:No past surgical history on file.  FAMILY HISTORY:  Family History  Problem Relation Age of Onset   Aneurysm Mother    Hypertension Mother     Cancer Father 33       Prostate    SOCIAL HISTORY:  Social History   Socioeconomic History   Marital status: Married    Spouse name: Not on file   Number of children: Not on file   Years of education: Not on file   Highest education level: Not on file  Occupational History   Not on file  Tobacco Use   Smoking status: Never   Smokeless tobacco: Never  Vaping Use   Vaping Use: Never used  Substance and Sexual Activity   Alcohol use: Not Currently   Drug use: Never   Sexual activity: Not on file  Other Topics Concern   Not on file  Social History Narrative   Not on file   Social Determinants of Health   Financial Resource Strain: Not on file  Food Insecurity: Not on file  Transportation Needs: Not on file  Physical Activity: Not on file  Stress: Not on file  Social Connections: Not on file  Intimate Partner Violence: Not on file    ALLERGIES: Codeine  MEDICATIONS:  Current Outpatient Medications  Medication Sig Dispense Refill   hydrochlorothiazide (HYDRODIURIL) 25 MG tablet TAKE ONE-HALF TABLET BY MOUTH EVERY DAY FOR BLOOD PRESSURE     sildenafil (VIAGRA) 100 MG tablet TAKE ONE-HALF TABLET BY MOUTH AS DIRECTED AS NEEDED FOR ERECTILE DYSFUNCTION ;1 HOUR BEFORE SEXUAL ACTIVITY. DO NOT TAKE WITHIN 6 HOURS OF TERAZOSIN, PRAZOSIN, ALFUZOSIN OR DOXAZOSIN. DO NOT TAKE MORE THAN  1 DOSE WITHIN 24 HOURS . DO NOT TAKE WITH NITRATES FOR ERECTILE DYSFUNCTION     acetaminophen (TYLENOL) 325 MG tablet Take 650 mg by mouth every 6 (six) hours as needed.     chlorhexidine (PERIDEX) 0.12 % solution Use as directed 15 mLs in the mouth or throat 2 (two) times daily. 473 mL 0   cholecalciferol (VITAMIN D3) 25 MCG (1000 UNIT) tablet Take 2,000 Units by mouth daily.     guaiFENesin (MUCINEX) 600 MG 12 hr tablet Take by mouth 2 (two) times daily.      hydrochlorothiazide (HYDRODIURIL) 12.5 MG tablet Take 12.5 mg by mouth daily.     ibuprofen (ADVIL,MOTRIN) 200 MG tablet Take 200 mg by mouth  every 6 (six) hours as needed.     losartan (COZAAR) 100 MG tablet Take 100 mg by mouth daily.     losartan-hydrochlorothiazide (HYZAAR) 100-12.5 MG tablet Take 1 tablet by mouth daily.     No current facility-administered medications for this encounter.    REVIEW OF SYSTEMS:  On review of systems, the patient reports that he is doing well overall. He denies any chest pain, shortness of breath, cough, fevers, chills, night sweats, unintended weight changes. He denies any bowel disturbances, and denies abdominal pain, nausea or vomiting. He denies any new musculoskeletal or joint aches or pains. His IPSS was 11, indicating mild urinary symptoms. His SHIM was 16, indicating he has moderate erectile dysfunction. A complete review of systems is obtained and is otherwise negative.    PHYSICAL EXAM:  Wt Readings from Last 3 Encounters:  09/14/21 183 lb 4 oz (83.1 kg)  09/09/20 186 lb 3.2 oz (84.5 kg)  08/18/20 187 lb 6.4 oz (85 kg)   Temp Readings from Last 3 Encounters:  09/14/21 (!) 96.6 F (35.9 C) (Temporal)  06/17/21 99.5 F (37.5 C)  11/04/18 98.2 F (36.8 C) (Oral)   BP Readings from Last 3 Encounters:  09/14/21 (!) 141/98  06/17/21 (!) 148/81  09/09/20 120/82   Pulse Readings from Last 3 Encounters:  09/14/21 65  06/17/21 (!) 109  09/09/20 74   Pain Assessment Pain Score: 0-No pain/10  In general this is a well appearing African-American male in no acute distress. He's alert and oriented x4 and appropriate throughout the examination. Cardiopulmonary assessment is negative for acute distress, and he exhibits normal effort.     KPS = 100  100 - Normal; no complaints; no evidence of disease. 90   - Able to carry on normal activity; minor signs or symptoms of disease. 80   - Normal activity with effort; some signs or symptoms of disease. 19   - Cares for self; unable to carry on normal activity or to do active work. 60   - Requires occasional assistance, but is able to  care for most of his personal needs. 50   - Requires considerable assistance and frequent medical care. 59   - Disabled; requires special care and assistance. 104   - Severely disabled; hospital admission is indicated although death not imminent. 86   - Very sick; hospital admission necessary; active supportive treatment necessary. 10   - Moribund; fatal processes progressing rapidly. 0     - Dead  Karnofsky DA, Abelmann WH, Craver LS and Burchenal JH [phone]) The use of the nitrogen mustards in the palliative treatment of carcinoma: with particular reference to bronchogenic carcinoma Cancer 1 634-56  LABORATORY DATA:  No results found for: WBC, HGB, HCT, MCV, PLT No results  found for: NA, K, CL, CO2 No results found for: ALT, AST, GGT, ALKPHOS, BILITOT   RADIOGRAPHY: No results found.    IMPRESSION/PLAN: 1. 65 y.o. gentleman with Stage T1c adenocarcinoma of the prostate with Gleason Score of 3+3, and PSA of 5.28. We discussed the patient's workup and outlined the nature of prostate cancer in this setting. The patient's T stage, Gleason's score, and PSA put him into the low risk group. Accordingly, he is eligible for a variety of potential treatment options including active surveillance, brachytherapy, 5.5 weeks of external radiation, or prostatectomy. We discussed the available radiation techniques, and focused on the details and logistics of delivery. We discussed and outlined the risks, benefits, short and long-term effects associated with radiotherapy and compared and contrasted these with prostatectomy. We discussed the role of SpaceOAR gel in reducing the rectal toxicity associated with radiotherapy. He appears to have a good understanding of his disease and our treatment recommendations which are of curative intent.  He was encouraged to ask questions that were answered to his stated satisfaction.  At the conclusion of our conversation, the patient is undecided regarding his final decision  but is leaning towards proceeding with active surveillance.  He is planning to keep his upcoming scheduled consult visit with Dr. Abner Greenspan on 10/12/2021 to further discuss surgical options and intends to make a final decision at that time.  He has our contact information and will let us know if he ultimately decides to proceed with radiotherapy and/or will call us with any additional questions or concerns.  We enjoyed meeting him and his wife today and look forward to continuing to follow his progress via correspondence.  We personally spent 70 minutes in this encounter including chart review, reviewing radiological studies, meeting face-to-face with the patient, entering orders and completing documentation.    Nicholos Johns, PA-C    Tyler Pita, MD  [facility] Direct Dial: [phone]   Fax: [phone] [email or web address]   Skype   LinkedIn   This document serves as a record of services personally performed by Tyler Pita, MD and Freeman Caldron, PA-C. It was created on their behalf by Wilburn Mylar, a trained medical scribe. The creation of this record is based on the scribe's personal observations and the provider's statements to them. This document has been checked and approved by the attending provider.

## 2021-10-14 NOTE — Progress Notes (Signed)
Pt was consult on 09/14/2021 for Stage T1c adenocarcinoma of the prostate with Gleason Score of 3+3, and PSA of 5.28.   Patient saw Dr. Abner Greenspan on 10/12/2021 and has decided to proceed with continued active surveillance.

## 2021-12-09 IMAGING — DX DG KNEE AP/LAT W/ SUNRISE*L*
3 series · 3 of 3 positions shown · non-contrast
Comparison: None.

CLINICAL DATA: Left knee pain over the last 3 months.

EXAM:
LEFT KNEE 3 VIEWS

[knee ap]
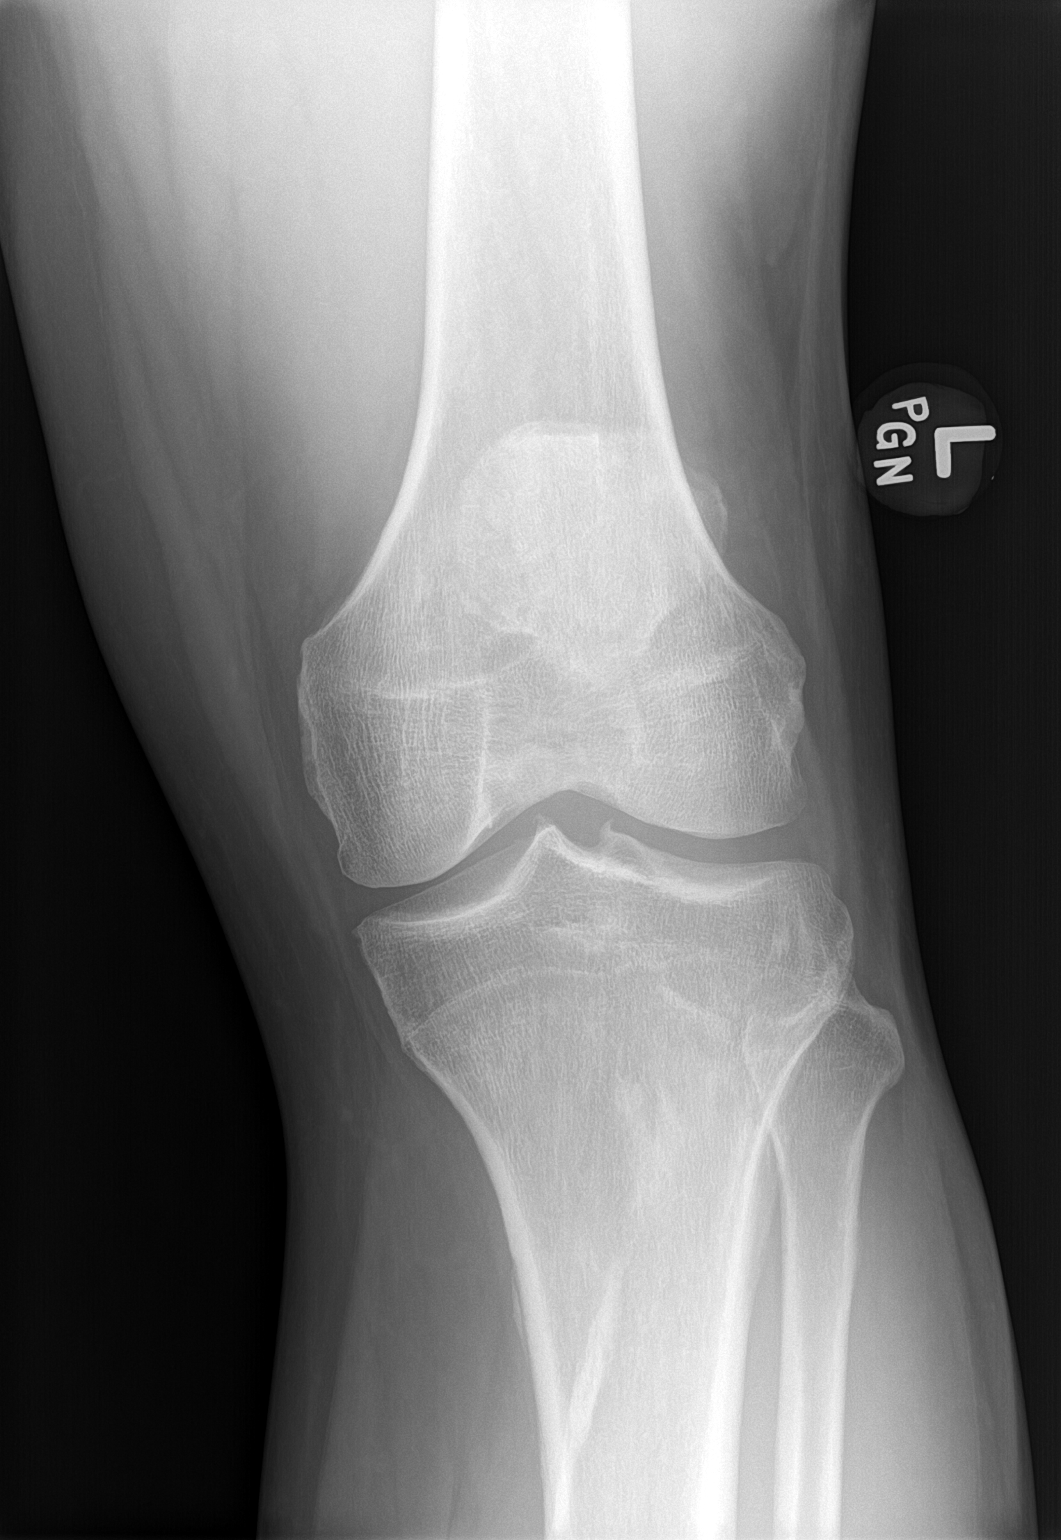

[knee lat]
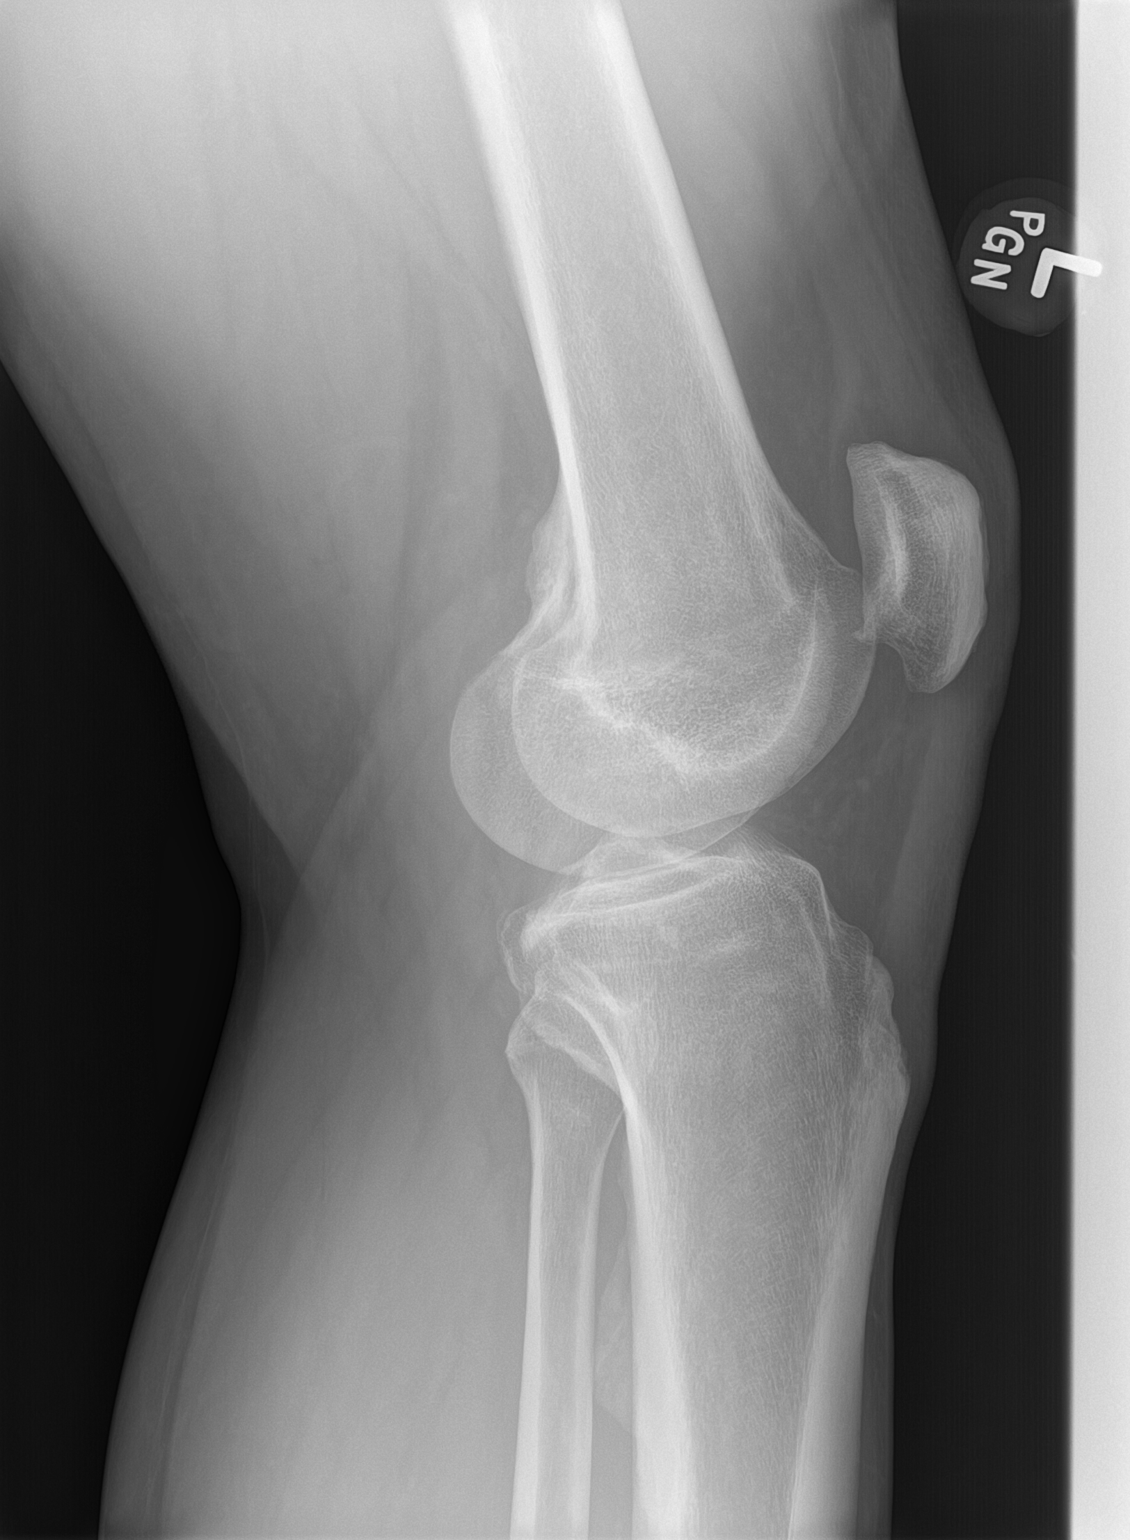

[patella]
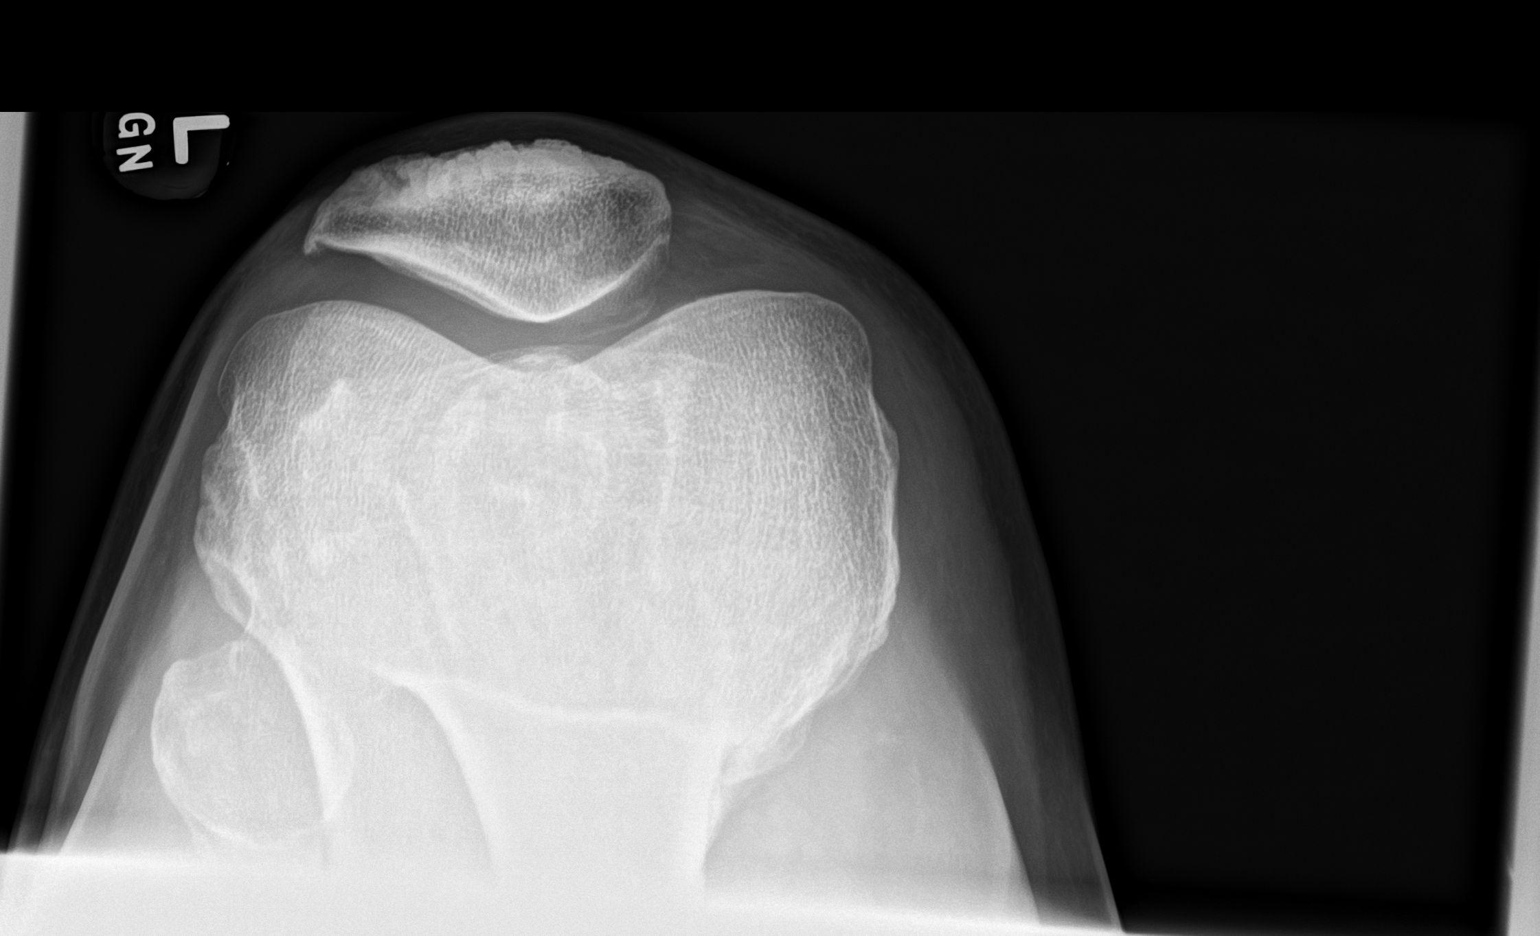

[3 of 3 positions shown; findings below may reference images not displayed]

FINDINGS: No visible joint effusion. No joint space narrowing at either
weight-bearing compartment or the patellofemoral joint. No marginal
osteophytes at either weight-bearing compartment. Tiny osteophyte at
the inferior patella. No focal bone lesion.
IMPRESSION: Normal radiographs for age. No evidence of joint effusion or any
significant degenerative finding by radiography.

## 2022-07-06 ENCOUNTER — Other Ambulatory Visit: Payer: Self-pay | Admitting: Urology

## 2022-07-06 DIAGNOSIS — R972 Elevated prostate specific antigen [PSA]: Secondary | ICD-10-CM

## 2022-08-08 ENCOUNTER — Ambulatory Visit
Admission: RE | Admit: 2022-08-08 | Discharge: 2022-08-08 | Disposition: A | Discharge status: Home / Self Care | Payer: Medicare Other | Source: Ambulatory Visit | Attending: Urology | Admitting: Urology

## 2022-08-08 DIAGNOSIS — R972 Elevated prostate specific antigen [PSA]: Secondary | ICD-10-CM

## 2022-08-08 MED ORDER — GADOPICLENOL 0.5 MMOL/ML IV SOLN
8.0000 mL | Freq: Once | INTRAVENOUS | Status: AC | PRN
  Administered 2022-08-08: 8 mL via INTRAVENOUS

## 2022-10-06 ENCOUNTER — Encounter: Payer: Self-pay | Admitting: Urology

## 2022-10-06 ENCOUNTER — Ambulatory Visit
Admission: RE | Admit: 2022-10-06 | Discharge: 2022-10-06 | Disposition: A | Discharge status: Home / Self Care | Payer: Medicare Other | Source: Ambulatory Visit | Attending: Urology | Admitting: Urology

## 2022-10-06 VITALS — Ht 69.0 in | Wt 173.0 lb

## 2022-10-06 DIAGNOSIS — Z79899 Other long term (current) drug therapy: Secondary | ICD-10-CM | POA: Insufficient documentation

## 2022-10-06 DIAGNOSIS — C61 Malignant neoplasm of prostate: Secondary | ICD-10-CM | POA: Insufficient documentation

## 2022-10-06 DIAGNOSIS — I10 Essential (primary) hypertension: Secondary | ICD-10-CM | POA: Diagnosis not present

## 2022-10-06 NOTE — Progress Notes (Addendum)
Follow-up-new nursing interview for a 67 y.o. gentleman with Stage T1c adenocarcinoma of the prostate with Gleason score of 3+3, and PSA of 5.28.  I verified patient's identity and began nursing interview. Patient reports moderate erectile dysfunction. No other related issues reported at this time.  Meaningful use complete. I-PSS score of 8-moderate SHIM-11 No urinary management medications. Urology appt- Pending w/ Dr. Lovena Neighbours at [facility].  Ht '5\' 9"'$  (1.753 m)   Wt 173 lb (78.5 kg)   BMI 25.55 kg/m   This concludes the interview.   Leandra Kern, LPN

## 2022-10-06 NOTE — Progress Notes (Signed)
Radiation Oncology         [phone] ________________________________  Outpatient Re-Consultation- via MyChart  Name: Fumio Vandam MRN: [id number]  Date: 10/06/2022  DOB: 12-Mar-1956  OF:[id number], No Pcp Per  Davis Gourd*   REFERRING PHYSICIAN: Davis Gourd*  DIAGNOSIS: 67 y.o. gentleman with Stage T1c adenocarcinoma of the prostate with Gleason score of 3+3, and PSA of 5.28.    ICD-10-CM   1. Malignant neoplasm of prostate ([place])  C61       HISTORY OF PRESENT ILLNESS: Makyle Eslick is a 67 y.o. male with a diagnosis of prostate cancer. He has been followed by Dr. Lovena Neighbours since at least 04/2020 for an elevated PSA, which was 3.54 at that time. He previously underwent prostate biopsy on 05/28/20 showing one small focus of Gleason 3+3 prostate cancer. They opted to proceed with active surveillance and he has continued to be followed closely since that time.  His PSA decreased to 2.71 in March 2022 but at the time of his recent follow up in 07/2021 his PSA had increased to 5.28. He underwent prostate MRI on 08/07/21 showing no evidence of high-grade prostate carcinoma (PI-RADS 2). The patient proceeded to surveillance transrectal ultrasound with 12 biopsies of the prostate on 08/30/21.  The prostate volume measured 31 cc.  Out of 12 core biopsies, 4 were positive, all on the right.  The maximum Gleason score was 3+3, and this was seen in the right base, right mid (small focus), right base lateral, and right apex lateral. We met with the patient in consultation on 09/14/21 to discuss treatment options and since his pathology continue to show low risk disease, he elected to continue in active surveillance at that time.  His PSA has remained stable, decreasing, in fact, at 3.64 in 04/2022 and 3.40 in 08/2022. A prostate MRI performed on 08/08/2022 showed 2 new lesions on the right with a PI-RADS 4 lesion at the right anterior base to mid gland and a PI-RADS 3 lesion in the right  apex.  He proceeded to fusion biopsy on 09/01/2022.  The prostate volume measured 35 cc.  Out of 20 core biopsies, 8 were positive, all on the right.  The highest Gleason score was 3+4 and this was seen in one of the 8 positive cores.  The other 7 cores, including all samples from the MRI ROIs, were Gleason 3+3.  The patient reviewed the recent biopsy results with his urologist and he has kindly been referred today for further discussion of potential radiation treatment options.  He also met with Dr. Alinda Money on 10/04/2022 to discuss his surgical options.   PREVIOUS RADIATION THERAPY: No  PAST MEDICAL HISTORY:  Past Medical History:  Diagnosis Date   Hypertension       PAST SURGICAL HISTORY:No past surgical history on file.  FAMILY HISTORY:  Family History  Problem Relation Age of Onset   Aneurysm Mother    Hypertension Mother    Cancer Father 83       Prostate    SOCIAL HISTORY:  Social History   Socioeconomic History   Marital status: Married    Spouse name: Not on file   Number of children: Not on file   Years of education: Not on file   Highest education level: Not on file  Occupational History   Not on file  Tobacco Use   Smoking status: Never   Smokeless tobacco: Never  Vaping Use   Vaping Use: Never used  Substance and Sexual Activity  Alcohol use: Not Currently   Drug use: Never   Sexual activity: Not on file  Other Topics Concern   Not on file  Social History Narrative   Not on file   Social Determinants of Health   Financial Resource Strain: Not on file  Food Insecurity: Not on file  Transportation Needs: Not on file  Physical Activity: Not on file  Stress: Not on file  Social Connections: Not on file  Intimate Partner Violence: Not on file    ALLERGIES: Codeine  MEDICATIONS:  Current Outpatient Medications  Medication Sig Dispense Refill   acetaminophen (TYLENOL) 325 MG tablet Take 650 mg by mouth every 6 (six) hours as needed.      chlorhexidine (PERIDEX) 0.12 % solution Use as directed 15 mLs in the mouth or throat 2 (two) times daily. 473 mL 0   cholecalciferol (VITAMIN D3) 25 MCG (1000 UNIT) tablet Take 2,000 Units by mouth daily.     guaiFENesin (MUCINEX) 600 MG 12 hr tablet Take by mouth 2 (two) times daily.      hydrochlorothiazide (HYDRODIURIL) 12.5 MG tablet Take 12.5 mg by mouth daily.     hydrochlorothiazide (HYDRODIURIL) 25 MG tablet TAKE ONE-HALF TABLET BY MOUTH EVERY DAY FOR BLOOD PRESSURE     ibuprofen (ADVIL,MOTRIN) 200 MG tablet Take 200 mg by mouth every 6 (six) hours as needed.     losartan (COZAAR) 100 MG tablet Take 100 mg by mouth daily.     losartan-hydrochlorothiazide (HYZAAR) 100-12.5 MG tablet Take 1 tablet by mouth daily.     sildenafil (VIAGRA) 100 MG tablet TAKE ONE-HALF TABLET BY MOUTH AS DIRECTED AS NEEDED FOR ERECTILE DYSFUNCTION ;1 HOUR BEFORE SEXUAL ACTIVITY. DO NOT TAKE WITHIN 6 HOURS OF TERAZOSIN, PRAZOSIN, ALFUZOSIN OR DOXAZOSIN. DO NOT TAKE MORE THAN 1 DOSE WITHIN 24 HOURS . DO NOT TAKE WITH NITRATES FOR ERECTILE DYSFUNCTION     No current facility-administered medications for this encounter.    REVIEW OF SYSTEMS:  On review of systems, the patient reports that he is doing well overall. He denies any chest pain, shortness of breath, cough, fevers, chills, night sweats, unintended weight changes. He denies any bowel disturbances, and denies abdominal pain, nausea or vomiting. He denies any new musculoskeletal or joint aches or pains. His IPSS was 8, indicating mild urinary symptoms with nocturia x2, weak flow of stream, frequency and feelings of incomplete bladder emptying. His SHIM was 11, indicating he has moderate-severe erectile dysfunction. A complete review of systems is obtained and is otherwise negative.    PHYSICAL EXAM:  Wt Readings from Last 3 Encounters:  09/14/21 183 lb 4 oz (83.1 kg)  09/09/20 186 lb 3.2 oz (84.5 kg)  08/18/20 187 lb 6.4 oz (85 kg)   Temp Readings from  Last 3 Encounters:  09/14/21 (!) 96.6 F (35.9 C) (Temporal)  06/17/21 99.5 F (37.5 C)  11/04/18 98.2 F (36.8 C) (Oral)   BP Readings from Last 3 Encounters:  09/14/21 (!) 141/98  06/17/21 (!) 148/81  09/09/20 120/82   Pulse Readings from Last 3 Encounters:  09/14/21 65  06/17/21 (!) 109  09/09/20 74    /10  In general this is a well appearing African-American male in no acute distress. He's alert and oriented x4 and appropriate throughout the examination. Cardiopulmonary assessment is negative for acute distress, and he exhibits normal effort.     KPS = 100  100 - Normal; no complaints; no evidence of disease. 90   - Able to carry  on normal activity; minor signs or symptoms of disease. 80   - Normal activity with effort; some signs or symptoms of disease. 61   - Cares for self; unable to carry on normal activity or to do active work. 60   - Requires occasional assistance, but is able to care for most of his personal needs. 50   - Requires considerable assistance and frequent medical care. 87   - Disabled; requires special care and assistance. 68   - Severely disabled; hospital admission is indicated although death not imminent. 31   - Very sick; hospital admission necessary; active supportive treatment necessary. 10   - Moribund; fatal processes progressing rapidly. 0     - Dead  Karnofsky DA, Abelmann WH, Craver LS and Burchenal JH [phone]) The use of the nitrogen mustards in the palliative treatment of carcinoma: with particular reference to bronchogenic carcinoma Cancer 1 634-56  LABORATORY DATA:  No results found for: "WBC", "HGB", "HCT", "MCV", "PLT" No results found for: "NA", "K", "CL", "CO2" No results found for: "ALT", "AST", "GGT", "ALKPHOS", "BILITOT"   RADIOGRAPHY: No results found.    IMPRESSION/PLAN: 1. 67 y.o. gentleman with Stage T1c adenocarcinoma of the prostate with Gleason Score of 3+3, and PSA of 5.28. We discussed the patient's workup and  outlined the nature of prostate cancer in this setting. The patient's T stage, Gleason's score, and PSA put him into the favorable intermediate risk group. Accordingly, he is eligible for a variety of potential treatment options including active surveillance, brachytherapy, 5.5 weeks of external radiation, or prostatectomy. We discussed the available radiation techniques, and focused on the details and logistics of delivery. We discussed and outlined the risks, benefits, short and long-term effects associated with radiotherapy and compared and contrasted these with prostatectomy. We discussed the role of SpaceOAR gel in reducing the rectal toxicity associated with radiotherapy. He appears to have a good understanding of his disease and our treatment recommendations which are of curative intent.  He was encouraged to ask questions that were answered to his stated satisfaction. He has done some extensive research on prostate SBRT and is interested in learning more about this treatment option which we do not currently offer.  I shared with him that there is strong data to support SBRT safety and efficacy.  Clinically, I am hoping to see more long term results validating the safety of large dose per fraction radiation on toxicities like urethral stricture and rectal injury.  I am also awaiting results from the [place] GU-005 study comparing SBRT to standard of care, which has closed to accrual, and likely be available soon.  At the conclusion of our conversation, the patient is interested in proceeding with a consultation with Dr. Colon Branch at [facility] to discuss prostate SBRT prior to making a final decision regarding treatment. We are happy to facilitate that referral and he has our contact information to let us know if he ultimately decides to proceed with IMRT or LDR-brachytherapy here in [place]. We will also share our discussion with Dr. Lovena Neighbours and Dr. Alinda Money to keep all informed. We enjoyed meeting him today and look  forward to following his progress. He knows that he is welcome to call at any time with questions or concerns regarding radiotherapy.  We personally spent 60 minutes in this encounter including chart review, reviewing radiological studies, meeting face-to-face with the patient, entering orders and completing documentation.    Nicholos Johns, PA-C    Tyler Pita, MD  [place]  [facility] Direct Dial: [phone]  Fax: [phone] [email or web address]  Skype  LinkedIn

## 2022-10-07 ENCOUNTER — Ambulatory Visit: Payer: TRICARE For Life (TFL)

## 2022-10-07 ENCOUNTER — Ambulatory Visit: Payer: TRICARE For Life (TFL) | Admitting: Urology

## 2022-11-01 NOTE — Progress Notes (Signed)
Patient was a follow-up consult on 10/06/2022 for his stage T1c adenocarcinoma of the prostate with Gleason Score of 3+3, and PSA of 5.28.   Patient had additional consult @ [facility] with Dr. Colon Branch to discuss SBRT.   RN spoke with patient and confirmed he has decided to proceed with treatment SBRT at [facility].   No further needs at this time.

## 2023-03-22 ENCOUNTER — Ambulatory Visit: Payer: Medicare Other | Attending: Internal Medicine | Admitting: Internal Medicine

## 2023-03-22 ENCOUNTER — Ambulatory Visit: Payer: TRICARE For Life (TFL) | Admitting: Student

## 2023-03-22 ENCOUNTER — Ambulatory Visit (INDEPENDENT_AMBULATORY_CARE_PROVIDER_SITE_OTHER): Payer: Medicare Other

## 2023-03-22 VITALS — BP 104/64 | HR 83 | Ht 69.0 in | Wt 183.0 lb

## 2023-03-22 DIAGNOSIS — R002 Palpitations: Secondary | ICD-10-CM | POA: Insufficient documentation

## 2023-03-22 NOTE — Progress Notes (Unsigned)
Enrolled patient for a 7 day Zio XT monitor to be mailed to patients home.  

## 2023-03-22 NOTE — Patient Instructions (Signed)
  Testing/Procedures:   ZIO XT- Long Term Monitor Instructions  Your physician has requested you wear a ZIO patch monitor for 7 days.  This is a single patch monitor. [employer] supplies one patch monitor per enrollment. Additional stickers are not available. Please do not apply patch if you will be having a Nuclear Stress Test,  Echocardiogram, Cardiac CT, MRI, or Chest Xray during the period you would be wearing the  monitor. The patch cannot be worn during these tests. You cannot remove and re-apply the  ZIO XT patch monitor.  Your ZIO patch monitor will be mailed 3 day USPS to your address on file. It may take 3-5 days  to receive your monitor after you have been enrolled.  Once you have received your monitor, please review the enclosed instructions. Your monitor  has already been registered assigning a specific monitor serial # to you.  Billing and Patient Assistance Program Information  We have supplied [employer] with any of your insurance information on file for billing purposes. [employer] offers a sliding scale Patient Assistance Program for patients that do not have  insurance, or whose insurance does not completely cover the cost of the ZIO monitor.  You must apply for the Patient Assistance Program to qualify for this discounted rate.  To apply, please call [employer] at [phone], select option 4, select option 2, ask to apply for  Patient Assistance Program. [employer] will ask your household income, and how many people  are in your household. They will quote your out-of-pocket cost based on that information.  [employer] will also be able to set up a 12-month, interest-free payment plan if needed.  Applying the monitor   Shave hair from upper left chest.  Hold abrader disc by orange tab. Rub abrader in 40 strokes over the upper left chest as  indicated in your monitor instructions.  Clean area with 4 enclosed alcohol pads. Let dry.  Apply patch as indicated in monitor  instructions. Patch will be placed under collarbone on left  side of chest with arrow pointing upward.  Rub patch adhesive wings for 2 minutes. Remove white label marked "1". Remove the white  label marked "2". Rub patch adhesive wings for 2 additional minutes.  While looking in a mirror, press and release button in center of patch. A small green light will  flash 3-4 times. This will be your only indicator that the monitor has been turned on.  Do not shower for the first 24 hours. You may shower after the first 24 hours.  Press the button if you feel a symptom. You will hear a small click. Record Date, Time and  Symptom in the Patient Logbook.  When you are ready to remove the patch, follow instructions on the last 2 pages of Patient  Logbook. Stick patch monitor onto the last page of Patient Logbook.  Place Patient Logbook in the blue and white box. Use locking tab on box and tape box closed  securely. The blue and white box has prepaid postage on it. Please place it in the mailbox as  soon as possible. Your physician should have your test results approximately 7 days after the  monitor has been mailed back to [employer].  Call [employer] Customer Care at [phone] if you have questions regarding  your ZIO XT patch monitor. Call them immediately if you see an orange light blinking on your  monitor.  If your monitor falls off in less than 4 days, contact our Monitor department   at [phone].  If your monitor becomes loose or falls off after 4 days call [employer] at [phone] for  suggestions on securing your monitor    Follow-Up: At [facility], you and your health needs are our priority.  As part of our continuing mission to provide you with exceptional heart care, we have created designated Provider Care Teams.  These Care Teams include your primary Cardiologist (physician) and Advanced Practice Providers (APPs -  Physician Assistants and Nurse Practitioners) who  all work together to provide you with the care you need, when you need it.  We recommend signing up for the patient portal called "MyChart".  Sign up information is provided on this After Visit Summary.  MyChart is used to connect with patients for Virtual Visits (Telemedicine).  Patients are able to view lab/test results, encounter notes, upcoming appointments, etc.  Non-urgent messages can be sent to your provider as well.   To learn more about what you can do with MyChart, go to [email or web address].    Your next appointment:    As needed 

## 2023-03-22 NOTE — Progress Notes (Deleted)
   Cardiology Clinic Note   Date: 03/22/2023 ID: Eric Hardin, DOB 21-Sep-1956, MRN [id number]  Primary Cardiologist:  None  Patient Profile    Shia Frickey is a 67 y.o. male who presents to the clinic today for ***    Past medical history significant for: ***     History of Present Illness    Neomia Dear ***   Today, patient ***   ROS: All other systems reviewed and are otherwise negative except as noted in History of Present Illness.  Studies Reviewed       ECG personally reviewed by me today: ***  No significant changes from ***  Risk Assessment/Calculations    {Does this patient have ATRIAL FIBRILLATION?:[phone]} No BP recorded.  {Refresh Note OR Click here to enter BP  :1}***        Physical Exam    VS:  There were no vitals taken for this visit. , BMI There is no height or weight on file to calculate BMI.  GEN: Well nourished, well developed, in no acute distress. Neck: No JVD or carotid bruits. Cardiac: *** RRR. No murmurs. No rubs or gallops.   Respiratory:  Respirations regular and unlabored. Clear to auscultation without rales, wheezing or rhonchi. GI: Soft, nontender, nondistended. Extremities: Radials/DP/PT 2+ and equal bilaterally. No clubbing or cyanosis. No edema ***  Skin: Warm and dry, no rash. Neuro: Strength intact.  Assessment & Plan   ***  Disposition: ***     {Are you ordering a CV Procedure (e.g. stress test, cath, DCCV, TEE, etc)?   Press F2        :[id number]}   Signed, Etta Grandchild. Taisha Pennebaker, DNP, NP-C

## 2023-03-22 NOTE — Progress Notes (Signed)
Cardiology Office Note:    Date:  03/22/2023   ID:  Eric Hardin, DOB 08-03-1956, MRN [id number]  PCP:  Patient, No Pcp Per   [facility] Providers Cardiologist:  None     Referring MD: No ref. provider found   No chief complaint on file. C/f afib  History of Present Illness:    Eric Hardin is a 67 y.o. male with a hx of well controlled HTN, prostate adenocarcinoma stage IIB, OSA on CPAP, self referral for afib shown on his smart watch. He is asymptomatic.  No thyroid disease. No DM2.  He did a stress test 20 years ago and that was normal.  Past Medical History:  Diagnosis Date   Hypertension     Current Medications: Current Outpatient Medications on File Prior to Visit  Medication Sig Dispense Refill   losartan-hydrochlorothiazide (HYZAAR) 100-12.5 MG tablet Take 1 tablet by mouth daily.     tamsulosin (FLOMAX) 0.4 MG CAPS capsule Take 1 capsule by mouth every evening.     No current facility-administered medications on file prior to visit.     Allergies:   Codeine   Social History   Socioeconomic History   Marital status: Married    Spouse name: Not on file   Number of children: Not on file   Years of education: Not on file   Highest education level: Not on file  Occupational History   Not on file  Tobacco Use   Smoking status: Never   Smokeless tobacco: Never  Vaping Use   Vaping Use: Never used  Substance and Sexual Activity   Alcohol use: Not Currently   Drug use: Never   Sexual activity: Not on file  Other Topics Concern   Not on file  Social History Narrative   Not on file   Social Determinants of Health   Financial Resource Strain: Not on file  Food Insecurity: Not on file  Transportation Needs: Not on file  Physical Activity: Not on file  Stress: Not on file  Social Connections: Not on file     Family History: The patient's family history includes Aneurysm in his mother; Cancer (age of onset: 79) in his father;  Hypertension in his mother.  ROS:   Please see the history of present illness.     All other systems reviewed and are negative.  EKGs/Labs/Other Studies Reviewed:    The following studies were reviewed today:       Recent Labs: No results found for requested labs within last 365 days.   Recent Lipid Panel No results found for: "CHOL", "TRIG", "HDL", "CHOLHDL", "VLDL", "LDLCALC", "LDLDIRECT"   Risk Assessment/Calculations:     Physical Exam:    VS:   Vitals:   03/22/23 1123  BP: 104/64  Pulse: 83  SpO2: 99%     Wt Readings from Last 3 Encounters:  10/06/22 173 lb (78.5 kg)  09/14/21 183 lb 4 oz (83.1 kg)  09/09/20 186 lb 3.2 oz (84.5 kg)     GEN:  Well nourished, well developed in no acute distress HEENT: Normal NECK: No JVD CARDIAC: RRR, no murmurs, rubs, gallops RESPIRATORY:  Clear to auscultation without rales, wheezing or rhonchi  ABDOMEN: Soft, non-tender, non-distended MUSCULOSKELETAL:  No edema; No deformity  SKIN: Warm and dry NEUROLOGIC:  Alert and oriented x 3 PSYCHIATRIC:  Normal affect   ASSESSMENT:   Apple watch noted afib. The baseline was wondering, still saw distinctive p waves. He is asymptomatic. Will get a cardiac monitor  PLAN:    In order of problems listed above:   7 day ziopatch Follow up PRN           Medication Adjustments/Labs and Tests Ordered: Current medicines are reviewed at length with the patient today.  Concerns regarding medicines are outlined above.  No orders of the defined types were placed in this encounter.  No orders of the defined types were placed in this encounter.   There are no Patient Instructions on file for this visit.   Signed, Maisie Fus, MD  03/22/2023 9:06 AM    [facility]

## 2023-03-28 DIAGNOSIS — R002 Palpitations: Secondary | ICD-10-CM | POA: Diagnosis not present

## 2023-04-14 ENCOUNTER — Encounter: Payer: Self-pay | Admitting: Internal Medicine

## 2023-04-14 ENCOUNTER — Telehealth: Payer: Self-pay | Admitting: Internal Medicine

## 2023-04-14 DIAGNOSIS — I4891 Unspecified atrial fibrillation: Secondary | ICD-10-CM | POA: Insufficient documentation

## 2023-04-14 DIAGNOSIS — I480 Paroxysmal atrial fibrillation: Secondary | ICD-10-CM

## 2023-04-14 MED ORDER — APIXABAN 5 MG PO TABS: 5.0000 mg | ORAL_TABLET | Freq: Two times a day (BID) | ORAL | 3 refills | Status: DC

## 2023-04-14 MED ORDER — DILTIAZEM HCL 30 MG PO TABS: 30.0000 mg | ORAL_TABLET | Freq: Four times a day (QID) | ORAL | 2 refills | Status: DC | PRN

## 2023-04-14 NOTE — Telephone Encounter (Signed)
Pt is requesting a provider switch from M. Branch to K. Tobb.

## 2023-04-14 NOTE — Telephone Encounter (Signed)
Called Mr. Gobble regarding his cardiac monitor. He did have atrial fibrillation. I left a VM and provided instructions via his mychart.

## 2023-04-18 ENCOUNTER — Ambulatory Visit: Payer: Medicare Other | Attending: Cardiology | Admitting: Cardiology

## 2023-04-18 VITALS — BP 122/78 | HR 71 | Ht 69.0 in | Wt 186.0 lb

## 2023-04-18 DIAGNOSIS — Z01818 Encounter for other preprocedural examination: Secondary | ICD-10-CM | POA: Insufficient documentation

## 2023-04-18 DIAGNOSIS — Z131 Encounter for screening for diabetes mellitus: Secondary | ICD-10-CM | POA: Insufficient documentation

## 2023-04-18 DIAGNOSIS — I10 Essential (primary) hypertension: Secondary | ICD-10-CM | POA: Insufficient documentation

## 2023-04-18 DIAGNOSIS — R072 Precordial pain: Secondary | ICD-10-CM | POA: Diagnosis present

## 2023-04-18 DIAGNOSIS — I4892 Unspecified atrial flutter: Secondary | ICD-10-CM | POA: Insufficient documentation

## 2023-04-18 DIAGNOSIS — I480 Paroxysmal atrial fibrillation: Secondary | ICD-10-CM | POA: Insufficient documentation

## 2023-04-18 DIAGNOSIS — E782 Mixed hyperlipidemia: Secondary | ICD-10-CM | POA: Insufficient documentation

## 2023-04-18 DIAGNOSIS — R0609 Other forms of dyspnea: Secondary | ICD-10-CM | POA: Diagnosis present

## 2023-04-18 MED ORDER — APIXABAN 5 MG PO TABS
5.0000 mg | ORAL_TABLET | Freq: Two times a day (BID) | ORAL | 3 refills | Status: DC
Start: 2023-04-18 — End: 2023-08-18

## 2023-04-18 MED ORDER — METOPROLOL TARTRATE 100 MG PO TABS
ORAL_TABLET | ORAL | 0 refills | Status: DC
Start: 2023-04-18 — End: 2023-08-18

## 2023-04-18 MED ORDER — METOPROLOL SUCCINATE ER 25 MG PO TB24
12.5000 mg | ORAL_TABLET | Freq: Every day | ORAL | 6 refills | Status: DC
Start: 2023-04-18 — End: 2023-08-18

## 2023-04-18 NOTE — Progress Notes (Signed)
Cardiology Office Note:    Date:  04/18/2023   ID:  Eric Hardin, DOB 1956/01/26, MRN [id number]  PCP:  Tamala Ser, MD  Cardiologist:  Thomasene Ripple, DO  Electrophysiologist:  None   Referring MD: Tamala Ser, MD   " I am doing okay"  History of Present Illness:    Eric Hardin is a 67 y.o. male with a hx of hypertension, recently diagnosed atrial flutter, ZIO monitor showed 1% burden which was symptomatic at times and other times asymptomatic, sleep apnea currently on CPAP here today for follow-up visit.  Patient initially saw Dr. Wyline Mood at his initial visit given he was experiencing dizziness and his watch had noted atrial fibrillation.  During that visit a monitor was placed on the patient.  He has been able to wear the monitor which showed 1% burden of atrial flutter.  Today he tells me that he has been experiencing intermittent dizziness has been for couple months now.  Comes and goes.  He also tells me that he has had some dyspnea on exertion.  While this is not every day is impressive when it happens.  No other complaints at this time.  Past Medical History:  Diagnosis Date   Hypertension     No past surgical history on file.  Current Medications: Current Meds  Medication Sig   losartan-hydrochlorothiazide (HYZAAR) 100-12.5 MG tablet Take 1 tablet by mouth daily.   metoprolol succinate (TOPROL XL) 25 MG 24 hr tablet Take 0.5 tablets (12.5 mg total) by mouth at bedtime.   metoprolol tartrate (LOPRESSOR) 100 MG tablet Take 2 hrs before procedure   [DISCONTINUED] apixaban (ELIQUIS) 5 MG TABS tablet Take 1 tablet (5 mg total) by mouth 2 (two) times daily.   [DISCONTINUED] diltiazem (CARDIZEM) 30 MG tablet Take 1 tablet (30 mg total) by mouth 4 (four) times daily as needed (elevated heart rates > 120 bpm).     Allergies:   Codeine   Social History   Socioeconomic History   Marital status: Married    Spouse name: Not on file   Number of children: Not  on file   Years of education: Not on file   Highest education level: Not on file  Occupational History   Not on file  Tobacco Use   Smoking status: Never   Smokeless tobacco: Never  Vaping Use   Vaping status: Never Used  Substance and Sexual Activity   Alcohol use: Not Currently   Drug use: Never   Sexual activity: Not on file  Other Topics Concern   Not on file  Social History Narrative   Not on file   Social Determinants of Health   Financial Resource Strain: Not on file  Food Insecurity: Not on file  Transportation Needs: Not on file  Physical Activity: Not on file  Stress: Not on file  Social Connections: Unknown (02/15/2022)   Received from [facility]   Social Network    Social Network: Not on file     Family History: The patient's family history includes Aneurysm in his mother; Cancer (age of onset: 56) in his father; Hypertension in his mother.  ROS:   Review of Systems  Constitution: Negative for decreased appetite, fever and weight gain.  HENT: Negative for congestion, ear discharge, hoarse voice and sore throat.   Eyes: Negative for discharge, redness, vision loss in right eye and visual halos.  Cardiovascular: Negative for chest pain, dyspnea on exertion, leg swelling, orthopnea and palpitations.  Respiratory: Negative for cough, hemoptysis,  shortness of breath and snoring.   Endocrine: Negative for heat intolerance and polyphagia.  Hematologic/Lymphatic: Negative for bleeding problem. Does not bruise/bleed easily.  Skin: Negative for flushing, nail changes, rash and suspicious lesions.  Musculoskeletal: Negative for arthritis, joint pain, muscle cramps, myalgias, neck pain and stiffness.  Gastrointestinal: Negative for abdominal pain, bowel incontinence, diarrhea and excessive appetite.  Genitourinary: Negative for decreased libido, genital sores and incomplete emptying.  Neurological: Negative for brief paralysis, focal weakness, headaches and loss of  balance.  Psychiatric/Behavioral: Negative for altered mental status, depression and suicidal ideas.  Allergic/Immunologic: Negative for HIV exposure and persistent infections.    EKGs/Labs/Other Studies Reviewed:    The following studies were reviewed today:   EKG: None today  ZIO monitor 0 triggered events. 1% burden of AF.     Patch Wear Time:  6 days and 20 hours (2024-06-25T22:07:39-0400 to 2024-07-02T18:29:11-398)   Patient had a min HR of 56 bpm, max HR of 193 bpm, and avg HR of 84 bpm. Predominant underlying rhythm was Sinus Rhythm. Atrial Flutter occurred (1% burden), ranging from 92-193 bpm (avg of 158 bpm), the longest lasting 21 mins 6 secs with an avg rate of  165 bpm. Atrial Flutter was detected within +/- 45 seconds of symptomatic patient event(s). Isolated SVEs were rare (<1.0%), SVE Couplets were rare (<1.0%), and SVE Triplets were rare (<1.0%). Isolated VEs were rare (<1.0%), and no VE Couplets or VE  Triplets were present.   Recent Labs: No results found for requested labs within last 365 days.  Recent Lipid Panel No results found for: "CHOL", "TRIG", "HDL", "CHOLHDL", "VLDL", "LDLCALC", "LDLDIRECT"  Physical Exam:    VS:  BP 122/78   Pulse 71   Ht 5\' 9"  (1.753 m)   Wt 186 lb (84.4 kg)   SpO2 99%   BMI 27.47 kg/m     Wt Readings from Last 3 Encounters:  04/18/23 186 lb (84.4 kg)  03/22/23 183 lb (83 kg)  10/06/22 173 lb (78.5 kg)     GEN: Well nourished, well developed in no acute distress HEENT: Normal NECK: No JVD; No carotid bruits LYMPHATICS: No lymphadenopathy CARDIAC: S1S2 noted,RRR, no murmurs, rubs, gallops RESPIRATORY:  Clear to auscultation without rales, wheezing or rhonchi  ABDOMEN: Soft, non-tender, non-distended, +bowel sounds, no guarding. EXTREMITIES: No edema, No cyanosis, no clubbing MUSCULOSKELETAL:  No deformity  SKIN: Warm and dry NEUROLOGIC:  Alert and oriented x 3, non-focal PSYCHIATRIC:  Normal affect, good  insight  ASSESSMENT:    1. Preprocedural examination   2. Paroxysmal atrial fibrillation (HCC)   3. Atrial flutter, unspecified type (HCC)   4. Dyspnea on exertion   5. Precordial chest pain   6. Screening for diabetes mellitus   7. Hypertension, unspecified type   8. Mixed hyperlipidemia    PLAN:     1.  Recently diagnosed atrial flutter-started the patient on Eliquis 5 mg twice a day.  Will start Toprol-XL 12.5 mg at bedtime.  In light of his new atrial flutter diagnosis again echocardiogram to assess for any structural abnormalities.  Once I am able to get more information I referred the patient to EP for rhythm control strategy.  2.  With dyspnea on exertion could be in the setting of his arrhythmia however he does have intermediate risk for coronary artery disease will get a coronary CTA to rule out coronary artery disease at this time.   3.  Continue CPAP  4.  Will get blood work today which include  CBC, c-Met, mag as well as TSH and will screen the patient for diabetes today.  The patient is in agreement with the above plan. The patient left the office in stable condition.  The patient will follow up in   Medication Adjustments/Labs and Tests Ordered: Current medicines are reviewed at length with the patient today.  Concerns regarding medicines are outlined above.  Orders Placed This Encounter  Procedures   CT CORONARY MORPH W/CTA COR W/SCORE W/CA W/CM &/OR WO/CM   TSH+T4F+T3Free   CBC With Differential   Basic metabolic panel   Hemoglobin A1c   Magnesium   EKG 12-Lead   ECHOCARDIOGRAM COMPLETE   Meds ordered this encounter  Medications   apixaban (ELIQUIS) 5 MG TABS tablet    Sig: Take 1 tablet (5 mg total) by mouth 2 (two) times daily.    Dispense:  180 tablet    Refill:  3   metoprolol succinate (TOPROL XL) 25 MG 24 hr tablet    Sig: Take 0.5 tablets (12.5 mg total) by mouth at bedtime.    Dispense:  30 tablet    Refill:  6   metoprolol tartrate  (LOPRESSOR) 100 MG tablet    Sig: Take 2 hrs before procedure    Dispense:  1 tablet    Refill:  0    Patient Instructions  Medication Instructions:  Your physician has recommended you make the following change in your medication:  STOP: Cardizem START: Eliquis 5mg  twice a day START: Metoprolol succinate (Toprol-XL) 12.5 mg at bedtime    *If you need a refill on your cardiac medications before your next appointment, please call your pharmacy*   Lab Work: BMET  MAGNESIUM TSH CBC  If you have labs (blood work) drawn today and your tests are completely normal, you will receive your results only by: MyChart Message (if you have MyChart) OR A paper copy in the mail If you have any lab test that is abnormal or we need to change your treatment, we will call you to review the results.   Testing/Procedures:   Your cardiac CT will be scheduled at one of the below locations:   [facility] [address] [place], [place] [address] [phone]   If scheduled at [facility], please arrive at the [facility] and Children's Entrance (Entrance C2) of [facility] 30 minutes prior to test start time. You can use the FREE valet parking offered at entrance C (encouraged to control the heart rate for the test)  Proceed to the [facility] Radiology Department (first floor) to check-in and test prep.  All radiology patients and guests should use entrance C2 at [facility], accessed from [facility], even though the hospital's physical address listed is [address]     Please follow these instructions carefully (unless otherwise directed):  An IV will be required for this test and Nitroglycerin will be given.  Hold all erectile dysfunction medications at least 3 days (72 hrs) prior to test. (Ie viagra, cialis, sildenafil, tadalafil, etc)   On the Night Before the Test: Be sure to Drink plenty of water. Do not consume any  caffeinated/decaffeinated beverages or chocolate 12 hours prior to your test. Do not take any antihistamines 12 hours prior to your test.  On the Day of the Test: Drink plenty of water until 1 hour prior to the test. Do not eat any food 1 hour prior to test. You may take your regular medications prior  to the test.  Take metoprolol tartrate (Lopressor) two hours prior to test. HOLD Metoprolol succinate (Toprol-XL) night before test. If you take Furosemide/Hydrochlorothiazide/Spironolactone, please HOLD on the morning of the test.      After the Test: Drink plenty of water. After receiving IV contrast, you may experience a mild flushed feeling. This is normal. On occasion, you may experience a mild rash up to 24 hours after the test. This is not dangerous. If this occurs, you can take Benadryl 25 mg and increase your fluid intake. If you experience trouble breathing, this can be serious. If it is severe call 911 IMMEDIATELY. If it is mild, please call our office. If you take any of these medications: Glipizide/Metformin, Avandament, Glucavance, please do not take 48 hours after completing test unless otherwise instructed.  We will call to schedule your test 2-4 weeks out understanding that some insurance companies will need an authorization prior to the service being performed.   For more information and frequently asked questions, please visit our website : [email or web address]  For non-scheduling related questions, please contact the cardiac imaging nurse navigator should you have any questions/concerns: Rockwell Alexandria, Cardiac Imaging Nurse Navigator Larey Brick, Cardiac Imaging Nurse Navigator [facility] Direct Office Dial: [phone]   Your physician has requested that you have an echocardiogram. Echocardiography is a painless test that uses sound waves to create images of your heart. It provides your doctor with information about the size  and shape of your heart and how well your heart's chambers and valves are working. This procedure takes approximately one hour. There are no restrictions for this procedure. Please do NOT wear cologne, perfume, aftershave, or lotions (deodorant is allowed). Please arrive 15 minutes prior to your appointment time.   For scheduling needs, including cancellations and rescheduling, please call [place], [phone].    Follow-Up: At [facility], you and your health needs are our priority.  As part of our continuing mission to provide you with exceptional heart care, we have created designated Provider Care Teams.  These Care Teams include your primary Cardiologist (physician) and Advanced Practice Providers (APPs -  Physician Assistants and Nurse Practitioners) who all work together to provide you with the care you need, when you need it.  We recommend signing up for the patient portal called "MyChart".  Sign up information is provided on this After Visit Summary.  MyChart is used to connect with patients for Virtual Visits (Telemedicine).  Patients are able to view lab/test results, encounter notes, upcoming appointments, etc.  Non-urgent messages can be sent to your provider as well.   To learn more about what you can do with MyChart, go to [email or web address].    Your next appointment:   4 month(s)  Provider:   Thomasene Ripple, DO     Adopting a Healthy Lifestyle.  Know what a healthy weight is for you (roughly BMI <25) and aim to maintain this   Aim for 7+ servings of fruits and vegetables daily   65-80+ fluid ounces of water or unsweet tea for healthy kidneys   Limit to max 1 drink of alcohol per day; avoid smoking/tobacco   Limit animal fats in diet for cholesterol and heart health - choose grass fed whenever available   Avoid highly processed foods, and foods high in saturated/trans fats   Aim for low stress - take time to unwind and care for your mental health    Aim for 150 min of moderate intensity exercise weekly  for heart health, and weights twice weekly for bone health   Aim for 7-9 hours of sleep daily   When it comes to diets, agreement about the perfect plan isnt easy to find, even among the experts. Experts at the [facility] [employer] developed an idea known as the Healthy Eating Plate. Just imagine a plate divided into logical, healthy portions.   The emphasis is on diet quality:   Load up on vegetables and fruits - one-half of your plate: Aim for color and variety, and remember that potatoes dont count.   Go for whole grains - one-quarter of your plate: Whole wheat, barley, wheat berries, quinoa, oats, brown rice, and foods made with them. If you want pasta, go with whole wheat pasta.   Protein power - one-quarter of your plate: Fish, chicken, beans, and nuts are all healthy, versatile protein sources. Limit red meat.   The diet, however, does go beyond the plate, offering a few other suggestions.   Use healthy plant oils, such as olive, canola, soy, corn, sunflower and peanut. Check the labels, and avoid partially hydrogenated oil, which have unhealthy trans fats.   If youre thirsty, drink water. Coffee and tea are good in moderation, but skip sugary drinks and limit milk and dairy products to one or two daily servings.   The type of carbohydrate in the diet is more important than the amount. Some sources of carbohydrates, such as vegetables, fruits, whole grains, and beans-are healthier than others.   Finally, stay active  Signed, Thomasene Ripple, DO  04/18/2023 10:10 AM    [facility]

## 2023-04-18 NOTE — Patient Instructions (Addendum)
Medication Instructions:  Your physician has recommended you make the following change in your medication:  STOP: Cardizem START: Eliquis 5mg  twice a day START: Metoprolol succinate (Toprol-XL) 12.5 mg at bedtime    *If you need a refill on your cardiac medications before your next appointment, please call your pharmacy*   Lab Work: BMET  MAGNESIUM TSH CBC  If you have labs (blood work) drawn today and your tests are completely normal, you will receive your results only by: MyChart Message (if you have MyChart) OR A paper copy in the mail If you have any lab test that is abnormal or we need to change your treatment, we will call you to review the results.   Testing/Procedures:   Your cardiac CT will be scheduled at one of the below locations:   [facility] [address] [place], [place] [address] [phone]   If scheduled at [facility], please arrive at the [facility] and Children's Entrance (Entrance C2) of [facility] 30 minutes prior to test start time. You can use the FREE valet parking offered at entrance C (encouraged to control the heart rate for the test)  Proceed to the [facility] Radiology Department (first floor) to check-in and test prep.  All radiology patients and guests should use entrance C2 at [facility], accessed from [facility], even though the hospital's physical address listed is [address]     Please follow these instructions carefully (unless otherwise directed):  An IV will be required for this test and Nitroglycerin will be given.  Hold all erectile dysfunction medications at least 3 days (72 hrs) prior to test. (Ie viagra, cialis, sildenafil, tadalafil, etc)   On the Night Before the Test: Be sure to Drink plenty of water. Do not consume any caffeinated/decaffeinated beverages or chocolate 12 hours prior to your test. Do not take any antihistamines 12 hours prior to your  test.  On the Day of the Test: Drink plenty of water until 1 hour prior to the test. Do not eat any food 1 hour prior to test. You may take your regular medications prior to the test.  Take metoprolol tartrate (Lopressor) two hours prior to test. HOLD Metoprolol succinate (Toprol-XL) night before test. If you take Furosemide/Hydrochlorothiazide/Spironolactone, please HOLD on the morning of the test.      After the Test: Drink plenty of water. After receiving IV contrast, you may experience a mild flushed feeling. This is normal. On occasion, you may experience a mild rash up to 24 hours after the test. This is not dangerous. If this occurs, you can take Benadryl 25 mg and increase your fluid intake. If you experience trouble breathing, this can be serious. If it is severe call 911 IMMEDIATELY. If it is mild, please call our office. If you take any of these medications: Glipizide/Metformin, Avandament, Glucavance, please do not take 48 hours after completing test unless otherwise instructed.  We will call to schedule your test 2-4 weeks out understanding that some insurance companies will need an authorization prior to the service being performed.   For more information and frequently asked questions, please visit our website : [email or web address]  For non-scheduling related questions, please contact the cardiac imaging nurse navigator should you have any questions/concerns: Rockwell Alexandria, Cardiac Imaging Nurse Navigator Larey Brick, Cardiac Imaging Nurse Navigator [facility] Direct Office Dial: [phone]   Your physician has requested that you have an echocardiogram. Echocardiography is a  painless test that uses sound waves to create images of your heart. It provides your doctor with information about the size and shape of your heart and how well your heart's chambers and valves are working. This procedure takes approximately one hour. There  are no restrictions for this procedure. Please do NOT wear cologne, perfume, aftershave, or lotions (deodorant is allowed). Please arrive 15 minutes prior to your appointment time.   For scheduling needs, including cancellations and rescheduling, please call [place], [phone].    Follow-Up: At [facility], you and your health needs are our priority.  As part of our continuing mission to provide you with exceptional heart care, we have created designated Provider Care Teams.  These Care Teams include your primary Cardiologist (physician) and Advanced Practice Providers (APPs -  Physician Assistants and Nurse Practitioners) who all work together to provide you with the care you need, when you need it.  We recommend signing up for the patient portal called "MyChart".  Sign up information is provided on this After Visit Summary.  MyChart is used to connect with patients for Virtual Visits (Telemedicine).  Patients are able to view lab/test results, encounter notes, upcoming appointments, etc.  Non-urgent messages can be sent to your provider as well.   To learn more about what you can do with MyChart, go to [email or web address].    Your next appointment:   4 month(s)  Provider:   Thomasene Ripple, DO

## 2023-04-19 LAB — CBC WITH DIFFERENTIAL
Basophils Absolute: 0 10*3/uL (ref 0.0–0.2)
Basos: 1 %
EOS (ABSOLUTE): 0.1 10*3/uL (ref 0.0–0.4)
Eos: 2 %
Hematocrit: 47.6 % (ref 37.5–51.0)
Hemoglobin: 15.7 g/dL (ref 13.0–17.7)
Immature Grans (Abs): 0 10*3/uL (ref 0.0–0.1)
Immature Granulocytes: 1 %
Lymphocytes Absolute: 0.5 10*3/uL — ABNORMAL LOW (ref 0.7–3.1)
Lymphs: 12 %
MCH: 29.6 pg (ref 26.6–33.0)
MCHC: 33 g/dL (ref 31.5–35.7)
MCV: 90 fL (ref 79–97)
Monocytes Absolute: 0.5 10*3/uL (ref 0.1–0.9)
Monocytes: 13 %
Neutrophils Absolute: 2.9 10*3/uL (ref 1.4–7.0)
Neutrophils: 71 %
RBC: 5.31 x10E6/uL (ref 4.14–5.80)
RDW: 13.7 % (ref 11.6–15.4)
WBC: 4 10*3/uL (ref 3.4–10.8)

## 2023-04-19 LAB — BASIC METABOLIC PANEL
BUN/Creatinine Ratio: 9 — ABNORMAL LOW (ref 10–24)
BUN: 13 mg/dL (ref 8–27)
CO2: 27 mmol/L (ref 20–29)
Calcium: 9.2 mg/dL (ref 8.6–10.2)
Chloride: 103 mmol/L (ref 96–106)
Creatinine, Ser: 1.42 mg/dL — ABNORMAL HIGH (ref 0.76–1.27)
Glucose: 85 mg/dL (ref 70–99)
Potassium: 4.5 mmol/L (ref 3.5–5.2)
Sodium: 141 mmol/L (ref 134–144)
eGFR: 54 mL/min/{1.73_m2} — ABNORMAL LOW (ref >59)

## 2023-04-19 LAB — MAGNESIUM: Magnesium: 2 mg/dL (ref 1.6–2.3)

## 2023-04-19 LAB — TSH+T4F+T3FREE
Free T4: 1.22 ng/dL (ref 0.82–1.77)
T3, Free: 2.8 pg/mL (ref 2.0–4.4)
TSH: 0.92 u[IU]/mL (ref 0.450–4.500)

## 2023-04-20 LAB — SPECIMEN STATUS REPORT

## 2023-04-20 LAB — HEMOGLOBIN A1C
Est. average glucose Bld gHb Est-mCnc: 114 mg/dL
Hgb A1c MFr Bld: 5.6 % (ref 4.8–5.6)

## 2023-04-22 ENCOUNTER — Telehealth: Payer: Medicare Other | Admitting: Nurse Practitioner

## 2023-04-22 DIAGNOSIS — U071 COVID-19: Secondary | ICD-10-CM

## 2023-04-22 MED ORDER — MOLNUPIRAVIR EUA 200MG CAPSULE: 4.0000 | ORAL_CAPSULE | Freq: Two times a day (BID) | ORAL | 0 refills | Status: AC

## 2023-04-22 MED ORDER — PROMETHAZINE-DM 6.25-15 MG/5ML PO SYRP: 5.0000 mL | ORAL_SOLUTION | Freq: Four times a day (QID) | ORAL | 0 refills | Status: DC | PRN

## 2023-04-22 NOTE — Patient Instructions (Signed)
Neomia Dear, thank you for joining Bennie Pierini, FNP for today's virtual visit.  While this provider is not your primary care provider (PCP), if your PCP is located in our provider database this encounter information will be shared with them immediately following your visit.   A [facility] MyChart account gives you access to today's visit and all your visits, tests, and labs performed at [facility] " click here if you don't have a [facility] MyChart account or go to [email or web address]  Consent: (Patient) Eric Hardin provided verbal consent for this virtual visit at the beginning of the encounter.  Current Medications:  Current Outpatient Medications:    molnupiravir EUA (LAGEVRIO) 200 mg CAPS capsule, Take 4 capsules (800 mg total) by mouth 2 (two) times daily for 5 days., Disp: 40 capsule, Rfl: 0   promethazine-dextromethorphan (PROMETHAZINE-DM) 6.25-15 MG/5ML syrup, Take 5 mLs by mouth 4 (four) times daily as needed for cough., Disp: 118 mL, Rfl: 0   apixaban (ELIQUIS) 5 MG TABS tablet, Take 1 tablet (5 mg total) by mouth 2 (two) times daily., Disp: 180 tablet, Rfl: 3   losartan-hydrochlorothiazide (HYZAAR) 100-12.5 MG tablet, Take 1 tablet by mouth daily., Disp: , Rfl:    metoprolol succinate (TOPROL XL) 25 MG 24 hr tablet, Take 0.5 tablets (12.5 mg total) by mouth at bedtime., Disp: 30 tablet, Rfl: 6   metoprolol tartrate (LOPRESSOR) 100 MG tablet, Take 2 hrs before procedure, Disp: 1 tablet, Rfl: 0   Medications ordered in this encounter:  Meds ordered this encounter  Medications   molnupiravir EUA (LAGEVRIO) 200 mg CAPS capsule    Sig: Take 4 capsules (800 mg total) by mouth 2 (two) times daily for 5 days.    Dispense:  40 capsule    Refill:  0    Order Specific Question:   Supervising Provider    Answer:   Merrilee Jansky [id number]   promethazine-dextromethorphan (PROMETHAZINE-DM) 6.25-15 MG/5ML syrup    Sig: Take 5 mLs by mouth 4 (four)  times daily as needed for cough.    Dispense:  118 mL    Refill:  0    Order Specific Question:   Supervising Provider    Answer:   Merrilee Jansky [[id number]]     *If you need refills on other medications prior to your next appointment, please contact your pharmacy*  Follow-Up: Call back or seek an in-person evaluation if the symptoms worsen or if the condition fails to improve as anticipated.  [facility] [phone]  Other Instructions 1. Take meds as prescribed 2. Use a cool mist humidifier especially during the winter months and when heat has been humid. 3. Use saline nose sprays frequently 4. Saline irrigations of the nose can be very helpful if done frequently.  * 4X daily for 1 week*  * Use of a nettie pot can be helpful with this. Follow directions with this* 5. Drink plenty of fluids 6. Keep thermostat turn down low 7.For any cough or congestion- promethazine DM 8. For fever or aces or pains- take tylenol or ibuprofen appropriate for age and weight.  * for fevers greater than 101 orally you may alternate ibuprofen and tylenol every  3 hours.      If you have been instructed to have an in-person evaluation today at a local Urgent Care facility, please use the link below. It will take you to a list of all of our available [facility], including address, phone number and  hours of operation. Please do not delay care.  [facility]  If you or a family member do not have a primary care provider, use the link below to schedule a visit and establish care. When you choose a [facility] primary care physician or advanced practice provider, you gain a long-term partner in health. Find a Primary Care Provider  Learn more about [facility]'s in-office and virtual care options: [facility] - Get Care Now

## 2023-04-22 NOTE — Progress Notes (Signed)
Virtual Visit Consent   Eric Hardin, you are scheduled for a virtual visit with Mary-Margaret Daphine Deutscher, FNP, a [facility] provider, today.     Just as with appointments in the office, your consent must be obtained to participate.  Your consent will be active for this visit and any virtual visit you may have with one of our providers in the next 365 days.     If you have a MyChart account, a copy of this consent can be sent to you electronically.  All virtual visits are billed to your insurance company just like a traditional visit in the office.    As this is a virtual visit, video technology does not allow for your provider to perform a traditional examination.  This may limit your provider's ability to fully assess your condition.  If your provider identifies any concerns that need to be evaluated in person or the need to arrange testing (such as labs, EKG, etc.), we will make arrangements to do so.     Although advances in technology are sophisticated, we cannot ensure that it will always work on either your end or our end.  If the connection with a video visit is poor, the visit may have to be switched to a telephone visit.  With either a video or telephone visit, we are not always able to ensure that we have a secure connection.     I need to obtain your verbal consent now.   Are you willing to proceed with your visit today? YES   Eric Hardin has provided verbal consent on 04/22/2023 for a virtual visit (video or telephone).   Mary-Margaret Daphine Deutscher, FNP   Date: 04/22/2023 4:52 PM   Virtual Visit via Video Note   I, Mary-Margaret Daphine Deutscher, connected with Eric Hardin ([id number], September 18, 1956) on 04/22/23 at  5:15 PM EDT by a video-enabled telemedicine application and verified that I am speaking with the correct person using two identifiers.  Location: Patient: Virtual Visit Location Patient: Home Provider: Virtual Visit Location Provider: Mobile   I discussed the limitations  of evaluation and management by telemedicine and the availability of in person appointments. The patient expressed understanding and agreed to proceed.    History of Present Illness: Eric Hardin is a 67 y.o. who identifies as a male who was assigned male at birth, and is being seen today for covid positive.  HPI: URI  The current episode started in the past 7 days. The problem has been waxing and waning. There has been no fever. Associated symptoms include congestion, coughing, rhinorrhea and a sore throat. Pertinent negatives include no headaches or sinus pain. He has tried acetaminophen for the symptoms. The treatment provided mild relief.   Covid test this morning was positive  Review of Systems  HENT:  Positive for congestion, rhinorrhea and sore throat. Negative for sinus pain.   Respiratory:  Positive for cough.   Neurological:  Negative for headaches.    Problems:  Patient Active Problem List   Diagnosis Date Noted   Atrial fibrillation (HCC) 04/14/2023   Malignant neoplasm of prostate (HCC) 09/14/2021   HTN (hypertension) 05/19/2020    Allergies:  Allergies  Allergen Reactions   Codeine Rash    Other reaction(s): Eruption of skin   Medications:  Current Outpatient Medications:    apixaban (ELIQUIS) 5 MG TABS tablet, Take 1 tablet (5 mg total) by mouth 2 (two) times daily., Disp: 180 tablet, Rfl: 3   losartan-hydrochlorothiazide (HYZAAR) 100-12.5 MG tablet, Take 1  tablet by mouth daily., Disp: , Rfl:    metoprolol succinate (TOPROL XL) 25 MG 24 hr tablet, Take 0.5 tablets (12.5 mg total) by mouth at bedtime., Disp: 30 tablet, Rfl: 6   metoprolol tartrate (LOPRESSOR) 100 MG tablet, Take 2 hrs before procedure, Disp: 1 tablet, Rfl: 0  Observations/Objective: Patient is well-developed, well-nourished in no acute distress.  Resting comfortably  at home.  Head is normocephalic, atraumatic.  No labored breathing.  Speech is clear and coherent with logical content.   Patient is alert and oriented at baseline.  Raspy voice Dry cough  Assessment and Plan:  Eric Hardin in today with chief complaint of Covid Positive   1. Positive self-administered antigen test for COVID-19 1. Take meds as prescribed 2. Use a cool mist humidifier especially during the winter months and when heat has been humid. 3. Use saline nose sprays frequently 4. Saline irrigations of the nose can be very helpful if done frequently.  * 4X daily for 1 week*  * Use of a nettie pot can be helpful with this. Follow directions with this* 5. Drink plenty of fluids 6. Keep thermostat turn down low 7.For any cough or congestion- promethazine DM 8. For fever or aces or pains- take tylenol or ibuprofen appropriate for age and weight.  * for fevers greater than 101 orally you may alternate ibuprofen and tylenol every  3 hours.   Meds ordered this encounter  Medications   molnupiravir EUA (LAGEVRIO) 200 mg CAPS capsule    Sig: Take 4 capsules (800 mg total) by mouth 2 (two) times daily for 5 days.    Dispense:  40 capsule    Refill:  0    Order Specific Question:   Supervising Provider    Answer:   Merrilee Jansky [id number]   promethazine-dextromethorphan (PROMETHAZINE-DM) 6.25-15 MG/5ML syrup    Sig: Take 5 mLs by mouth 4 (four) times daily as needed for cough.    Dispense:  118 mL    Refill:  0    Order Specific Question:   Supervising Provider    Answer:   Merrilee Jansky [[id number]]     Follow Up Instructions: I discussed the assessment and treatment plan with the patient. The patient was provided an opportunity to ask questions and all were answered. The patient agreed with the plan and demonstrated an understanding of the instructions.  A copy of instructions were sent to the patient via MyChart.  The patient was advised to call back or seek an in-person evaluation if the symptoms worsen or if the condition fails to improve as anticipated.  Time:  I spent 12 minutes  with the patient via telehealth technology discussing the above problems/concerns.    Mary-Margaret Daphine Deutscher, FNP

## 2023-04-25 ENCOUNTER — Telehealth (HOSPITAL_COMMUNITY): Payer: Self-pay | Admitting: Emergency Medicine

## 2023-04-25 NOTE — Telephone Encounter (Signed)
Reaching out to patient to offer assistance regarding upcoming cardiac imaging study; pt verbalizes understanding of appt date/time, parking situation and where to check in, pre-test NPO status and medications ordered, and verified current allergies; name and call back number provided for further questions should they arise Rockwell Alexandria RN Navigator Cardiac Imaging [facility] [phone] office [phone] cell  100mg  metoprolol 2 hr prior States getting over covid (informed him to wear mask to appt)

## 2023-04-27 ENCOUNTER — Ambulatory Visit (HOSPITAL_COMMUNITY)
Admission: RE | Admit: 2023-04-27 | Discharge: 2023-04-27 | Disposition: A | Discharge status: Home / Self Care | Payer: Medicare Other | Source: Ambulatory Visit | Attending: Cardiology | Admitting: Cardiology

## 2023-04-27 DIAGNOSIS — R0609 Other forms of dyspnea: Secondary | ICD-10-CM | POA: Insufficient documentation

## 2023-04-27 DIAGNOSIS — R072 Precordial pain: Secondary | ICD-10-CM | POA: Diagnosis not present

## 2023-04-27 MED ORDER — NITROGLYCERIN 0.4 MG SL SUBL
SUBLINGUAL_TABLET | SUBLINGUAL | Status: AC
  Filled 2023-04-27: qty 2

## 2023-04-27 MED ORDER — NITROGLYCERIN 0.4 MG SL SUBL
0.8000 mg | SUBLINGUAL_TABLET | Freq: Once | SUBLINGUAL | Status: AC
  Administered 2023-04-27: 0.8 mg via SUBLINGUAL

## 2023-04-27 MED ORDER — IOHEXOL 350 MG/ML SOLN
100.0000 mL | Freq: Once | INTRAVENOUS | Status: AC | PRN
  Administered 2023-04-27: 100 mL via INTRAVENOUS

## 2023-05-10 ENCOUNTER — Ambulatory Visit (HOSPITAL_COMMUNITY): Payer: Medicare Other | Attending: Cardiovascular Disease

## 2023-05-10 DIAGNOSIS — I4892 Unspecified atrial flutter: Secondary | ICD-10-CM | POA: Diagnosis not present

## 2023-05-10 LAB — ECHOCARDIOGRAM COMPLETE
Area-P 1/2: 4.23 cm2
MV M vel: 5.12 m/s
MV Peak grad: 104.9 mmHg
S' Lateral: 2.3 cm

## 2023-08-18 ENCOUNTER — Ambulatory Visit: Payer: Medicare Other | Attending: Cardiology | Admitting: Cardiology

## 2023-08-18 ENCOUNTER — Encounter: Payer: Self-pay | Admitting: Cardiology

## 2023-08-18 VITALS — BP 110/82 | HR 68 | Ht 69.0 in | Wt 179.8 lb

## 2023-08-18 DIAGNOSIS — I10 Essential (primary) hypertension: Secondary | ICD-10-CM | POA: Diagnosis not present

## 2023-08-18 DIAGNOSIS — I480 Paroxysmal atrial fibrillation: Secondary | ICD-10-CM | POA: Diagnosis not present

## 2023-08-18 DIAGNOSIS — I4892 Unspecified atrial flutter: Secondary | ICD-10-CM | POA: Diagnosis not present

## 2023-08-18 DIAGNOSIS — Z79899 Other long term (current) drug therapy: Secondary | ICD-10-CM | POA: Diagnosis not present

## 2023-08-18 DIAGNOSIS — N183 Chronic kidney disease, stage 3 unspecified: Secondary | ICD-10-CM | POA: Diagnosis present

## 2023-08-18 MED ORDER — METOPROLOL SUCCINATE ER 25 MG PO TB24
12.5000 mg | ORAL_TABLET | Freq: Every day | ORAL | 3 refills | Status: DC
Start: 2023-08-18 — End: 2023-10-09

## 2023-08-18 MED ORDER — LOSARTAN POTASSIUM-HCTZ 50-12.5 MG PO TABS: 1.0000 | ORAL_TABLET | Freq: Every day | ORAL | 3 refills | Status: DC

## 2023-08-18 MED ORDER — APIXABAN 5 MG PO TABS: 5.0000 mg | ORAL_TABLET | Freq: Two times a day (BID) | ORAL | 3 refills | Status: AC

## 2023-08-18 NOTE — Progress Notes (Signed)
Cardiology Office Note:    Date:  08/18/2023   ID:  Eric Hardin, DOB [birth year], MRN [id number]  PCP:  Eric Ser, MD  Cardiologist:  Thomasene Ripple, DO  Electrophysiologist:  None   Referring MD: Eric Ser, MD   " I am doing fine"  History of Present Illness:    Eric Hardin is a 67 y.o. male with a hx of paroxysmal atrial fibrillation on eliquis, hypertension, CKD stage 3 here today for a follow up visit   He shares with me that his blood pressure has been falling below systolic of 100 mmHg with some lightheadedness. He is active and works out regularly. No other complaints at this time.  Past Medical History:  Diagnosis Date   Hypertension     No past surgical history on file.  Current Medications: Current Meds  Medication Sig   losartan-hydrochlorothiazide (HYZAAR) 50-12.5 MG tablet Take 1 tablet by mouth daily.   [DISCONTINUED] apixaban (ELIQUIS) 5 MG TABS tablet Take 1 tablet (5 mg total) by mouth 2 (two) times daily.   [DISCONTINUED] losartan-hydrochlorothiazide (HYZAAR) 100-12.5 MG tablet Take 1 tablet by mouth daily.   [DISCONTINUED] metoprolol succinate (TOPROL XL) 25 MG 24 hr tablet Take 0.5 tablets (12.5 mg total) by mouth at bedtime.     Allergies:   Codeine   Social History   Socioeconomic History   Marital status: Married    Spouse name: Not on file   Number of children: Not on file   Years of education: Not on file   Highest education level: Not on file  Occupational History   Not on file  Tobacco Use   Smoking status: Never   Smokeless tobacco: Never  Vaping Use   Vaping status: Never Used  Substance and Sexual Activity   Alcohol use: Not Currently   Drug use: Never   Sexual activity: Not on file  Other Topics Concern   Not on file  Social History Narrative   Not on file   Social Determinants of Health   Financial Resource Strain: Not on file  Food Insecurity: Not on file  Transportation Needs: Not on file   Physical Activity: Not on file  Stress: Not on file  Social Connections: Unknown (02/15/2022)   [facility] from [facility]   Social Network    Social Network: Not on file     Family History: The patient's family history includes Aneurysm in his mother; Cancer (age of onset: 77) in his father; Hypertension in his mother.  ROS:   Review of Systems  Constitution: Negative for decreased appetite, fever and weight gain.  HENT: Negative for congestion, ear discharge, hoarse voice and sore throat.   Eyes: Negative for discharge, redness, vision loss in right eye and visual halos.  Cardiovascular: Negative for chest pain, dyspnea on exertion, leg swelling, orthopnea and palpitations.  Respiratory: Negative for cough, hemoptysis, shortness of breath and snoring.   Endocrine: Negative for heat intolerance and polyphagia.  Hematologic/Lymphatic: Negative for bleeding problem. Does not bruise/bleed easily.  Skin: Negative for flushing, nail changes, rash and suspicious lesions.  Musculoskeletal: Negative for arthritis, joint pain, muscle cramps, myalgias, neck pain and stiffness.  Gastrointestinal: Negative for abdominal pain, bowel incontinence, diarrhea and excessive appetite.  Genitourinary: Negative for decreased libido, genital sores and incomplete emptying.  Neurological: Negative for brief paralysis, focal weakness, headaches and loss of balance.  Psychiatric/Behavioral: Negative for altered mental status, depression and suicidal ideas.  Allergic/Immunologic: Negative for HIV exposure and persistent infections.  EKGs/Labs/Other Studies Reviewed:    The following studies were reviewed today:   EKG:  The ekg ordered today demonstrates   Recent Labs: 04/18/2023: BUN 13; Creatinine, Hardin 1.42; Hemoglobin 15.7; Magnesium 2.0; Potassium 4.5; Sodium 141; TSH 0.920  Recent Lipid Panel No results found for: "CHOL", "TRIG", "HDL", "CHOLHDL", "VLDL", "LDLCALC",  "LDLDIRECT"  Physical Exam:    VS:  BP 110/82 (BP Location: Right Arm, Patient Position: Sitting, Cuff Size: Normal)   Pulse 68   Ht 5\' 9"  (1.753 m)   Wt 179 lb 12.8 oz (81.6 kg)   SpO2 99%   BMI 26.55 kg/m     Wt Readings from Last 3 Encounters:  08/18/23 179 lb 12.8 oz (81.6 kg)  04/18/23 186 lb (84.4 kg)  03/22/23 183 lb (83 kg)     GEN: Well nourished, well developed in no acute distress HEENT: Normal NECK: No JVD; No carotid bruits LYMPHATICS: No lymphadenopathy CARDIAC: S1S2 noted,RRR, no murmurs, rubs, gallops RESPIRATORY:  Clear to auscultation without rales, wheezing or rhonchi  ABDOMEN: Soft, non-tender, non-distended, +bowel sounds, no guarding. EXTREMITIES: No edema, No cyanosis, no clubbing MUSCULOSKELETAL:  No deformity  SKIN: Warm and dry NEUROLOGIC:  Alert and oriented x 3, non-focal PSYCHIATRIC:  Normal affect, good insight  ASSESSMENT:    1. Paroxysmal atrial fibrillation (HCC)   2. Atrial flutter, unspecified type (HCC)   3. Medication management   4. Hypertension, unspecified type   5. Stage 3 chronic kidney disease, unspecified whether stage 3a or 3b CKD (HCC)    PLAN:     Hypertension Patient's home blood pressure readings are variable with some readings below 100 systolic. Patient reports occasional lightheadedness. Currently on Losartan-Hydrochlorothiazide 100-12.5mg  and Metoprolol for atrial fibrillation. -Reduce Losartan-Hydrochlorothiazide to 50-12.5mg  daily to prevent hypotensive episodes. -Continue Metoprolol and Eliquis for atrial fibrillation. -Check blood pressure regularly at home and report if systolic readings consistently above 130.  Renal function Previous concern about elevated kidney function tests. -Order repeat blood work to assess current kidney function.  PAF - in sinus rhythm today continue eliquis for stroke prevention, Chadvasc 2 score is 2. Continue metoprolol  CKD stage 2- avoid nephro toxins  Follow-up No acute  issues, stable on current regimen. -Annual follow-up visits, unless issues arise. -Check blood work results next month with primary care provider. -Provide paper prescription for [facility] as requested.  The patient is in agreement with the above plan. The patient left the office in stable condition.  The patient will follow up in 1 year.   Medication Adjustments/Labs and Tests Ordered: Current medicines are reviewed at length with the patient today.  Concerns regarding medicines are outlined above.  Orders Placed This Encounter  Procedures   Comprehensive Metabolic Panel (CMET)   Magnesium   EKG 12-Lead   Meds ordered this encounter  Medications   losartan-hydrochlorothiazide (HYZAAR) 50-12.5 MG tablet    Sig: Take 1 tablet by mouth daily.    Dispense:  90 tablet    Refill:  3   apixaban (ELIQUIS) 5 MG TABS tablet    Sig: Take 1 tablet (5 mg total) by mouth 2 (two) times daily.    Dispense:  180 tablet    Refill:  3   metoprolol succinate (TOPROL XL) 25 MG 24 hr tablet    Sig: Take 0.5 tablets (12.5 mg total) by mouth at bedtime.    Dispense:  45 tablet    Refill:  3    Patient Instructions  Medication Instructions:  Your physician has  recommended you make the following change in your medication:  DECREASE: Losartan-Hydrochlorothiazide 52-12.5 mg once daily  *If you need a refill on your cardiac medications before your next appointment, please call your pharmacy*   Lab Work: CMET, MAG If you have labs (blood work) drawn today and your tests are completely normal, you will receive your results only by: MyChart Message (if you have MyChart) OR A paper copy in the mail If you have any lab test that is abnormal or we need to change your treatment, we will call you to review the results.    Follow-Up: At [facility], you and your health needs are our priority.  As part of our continuing mission to provide you with exceptional heart care, we have created designated  Provider Care Teams.  These Care Teams include your primary Cardiologist (physician) and Advanced Practice Providers (APPs -  Physician Assistants and Nurse Practitioners) who all work together to provide you with the care you need, when you need it.   Your next appointment:   1 year(s)  Provider:   Thomasene Ripple, DO    Adopting a Healthy Lifestyle.  Know what a healthy weight is for you (roughly BMI <25) and aim to maintain this   Aim for 7+ servings of fruits and vegetables daily   65-80+ fluid ounces of water or unsweet tea for healthy kidneys   Limit to max 1 drink of alcohol per day; avoid smoking/tobacco   Limit animal fats in diet for cholesterol and heart health - choose grass fed whenever available   Avoid highly processed foods, and foods high in saturated/trans fats   Aim for low stress - take time to unwind and care for your mental health   Aim for 150 min of moderate intensity exercise weekly for heart health, and weights twice weekly for bone health   Aim for 7-9 hours of sleep daily   When it comes to diets, agreement about the perfect plan isnt easy to find, even among the experts. Experts at the [facility] [employer] developed an idea known as the Healthy Eating Plate. Just imagine a plate divided into logical, healthy portions.   The emphasis is on diet quality:   Load up on vegetables and fruits - one-half of your plate: Aim for color and variety, and remember that potatoes dont count.   Go for whole grains - one-quarter of your plate: Whole wheat, barley, wheat berries, quinoa, oats, brown rice, and foods made with them. If you want pasta, go with whole wheat pasta.   Protein power - one-quarter of your plate: Fish, chicken, beans, and nuts are all healthy, versatile protein sources. Limit red meat.   The diet, however, does go beyond the plate, offering a few other suggestions.   Use healthy plant oils, such as olive, canola, soy, corn,  sunflower and peanut. Check the labels, and avoid partially hydrogenated oil, which have unhealthy trans fats.   If youre thirsty, drink water. Coffee and tea are good in moderation, but skip sugary drinks and limit milk and dairy products to one or two daily servings.   The type of carbohydrate in the diet is more important than the amount. Some sources of carbohydrates, such as vegetables, fruits, whole grains, and beans-are healthier than others.   Finally, stay active  Signed, Thomasene Ripple, DO  08/18/2023 2:38 PM    [facility]

## 2023-08-18 NOTE — Patient Instructions (Signed)
Medication Instructions:  Your physician has recommended you make the following change in your medication:  DECREASE: Losartan-Hydrochlorothiazide 52-12.5 mg once daily  *If you need a refill on your cardiac medications before your next appointment, please call your pharmacy*   Lab Work: CMET, MAG If you have labs (blood work) drawn today and your tests are completely normal, you will receive your results only by: MyChart Message (if you have MyChart) OR A paper copy in the mail If you have any lab test that is abnormal or we need to change your treatment, we will call you to review the results.    Follow-Up: At [facility], you and your health needs are our priority.  As part of our continuing mission to provide you with exceptional heart care, we have created designated Provider Care Teams.  These Care Teams include your primary Cardiologist (physician) and Advanced Practice Providers (APPs -  Physician Assistants and Nurse Practitioners) who all work together to provide you with the care you need, when you need it.   Your next appointment:   1 year(s)  Provider:   Thomasene Ripple, DO

## 2023-08-19 LAB — COMPREHENSIVE METABOLIC PANEL
ALT: 34 [IU]/L (ref 0–44)
AST: 33 [IU]/L (ref 0–40)
Albumin: 4.4 g/dL (ref 3.9–4.9)
Alkaline Phosphatase: 72 [IU]/L (ref 44–121)
BUN/Creatinine Ratio: 10 (ref 10–24)
BUN: 16 mg/dL (ref 8–27)
Bilirubin Total: 0.8 mg/dL (ref 0.0–1.2)
CO2: 30 mmol/L — ABNORMAL HIGH (ref 20–29)
Calcium: 9.8 mg/dL (ref 8.6–10.2)
Chloride: 101 mmol/L (ref 96–106)
Creatinine, Ser: 1.59 mg/dL — ABNORMAL HIGH (ref 0.76–1.27)
Globulin, Total: 3 g/dL (ref 1.5–4.5)
Glucose: 94 mg/dL (ref 70–99)
Potassium: 4.3 mmol/L (ref 3.5–5.2)
Sodium: 141 mmol/L (ref 134–144)
Total Protein: 7.4 g/dL (ref 6.0–8.5)
eGFR: 48 mL/min/{1.73_m2} — ABNORMAL LOW (ref >59)

## 2023-08-19 LAB — MAGNESIUM: Magnesium: 2 mg/dL (ref 1.6–2.3)

## 2023-09-06 ENCOUNTER — Other Ambulatory Visit: Payer: Self-pay

## 2023-09-06 DIAGNOSIS — Z79899 Other long term (current) drug therapy: Secondary | ICD-10-CM

## 2023-09-06 MED ORDER — LOSARTAN POTASSIUM 50 MG PO TABS: 50.0000 mg | ORAL_TABLET | Freq: Every day | ORAL | 3 refills | Status: AC

## 2023-10-09 ENCOUNTER — Other Ambulatory Visit: Payer: Self-pay

## 2023-10-09 MED ORDER — CARVEDILOL 6.25 MG PO TABS
6.2500 mg | ORAL_TABLET | Freq: Two times a day (BID) | ORAL | 5 refills | Status: AC
Start: 2023-10-09 — End: ?

## 2024-05-06 ENCOUNTER — Encounter: Payer: Self-pay | Admitting: Neurology

## 2024-05-17 ENCOUNTER — Encounter: Payer: Self-pay | Admitting: Neurology

## 2024-05-17 ENCOUNTER — Ambulatory Visit (INDEPENDENT_AMBULATORY_CARE_PROVIDER_SITE_OTHER): Admitting: Neurology

## 2024-05-17 VITALS — BP 142/93 | HR 81 | Resp 20 | Ht 69.0 in | Wt 179.0 lb

## 2024-05-17 DIAGNOSIS — R413 Other amnesia: Secondary | ICD-10-CM | POA: Diagnosis not present

## 2024-05-17 NOTE — Progress Notes (Signed)
 NEUROLOGY CONSULTATION NOTE  Eric Hardin MRN: [id number] DOB: [birth year]  Referring provider: Dr. Dorn Greet Primary care provider: Dr. Dorn Greet  Reason for consult:  memory loss  Dear Dr Greet:  Thank you for your kind referral of Eric Hardin for consultation of the above symptoms. Although his history is well known to you, please allow me to reiterate it for the purpose of our medical record. He is alone in the office today. Records and images were personally reviewed where available.   HISTORY OF PRESENT ILLNESS: This is a 68 year old right-handed man with a history of hypertension, PAF, prostate cancer, OSA on CPAP, presenting for evaluation of memory loss. He reports symptoms started 1-2 years ago. He mostly notices that he gets distracted easily between places. He goes from the kitchen to the bedroom and forgets what he was going for. He denies getting lost driving, but sometimes he realizes he passed by the destination. He uses a GPS all the time. He forgets names of people he should know, ones he has not seen in a while. He denies missing medications but needs to use a pillbox to keep him focused. Most of bills are on autopay, he denies missing payments. He does not cook. He denies a prior history of inattention when younger, I'm a great listener.  [facility], bloodwork showed normal TSH (1.138), B12 (525). His vitamin D level was low at 11.1. His paternal and maternal aunts had memory changes in their 79s. He denies any head injuries but used to be a Patent attorney. He endorses a diagnosis of anxiety, he is not taking medication for this. He feels his mood is fairly good.   He used to have migraines when younger but in the past 5 years has only had a couple a year. He has headaches currently due to dental issues. He gets dizziness with positional changes. He denies any diplopia, dysarthria/dysphagia, neck/back pain, focal  numbness/tingling/weakness, bowel/bladder dysfunction. He has a CPAP but feels he does not rest well despite 10 hours of sleep. He gets up 3 times to urinate. He feels drowsy during the day and takes naps. He exercises regularly. He notes that when he eats certain foods, he gets congested with runny nose. He lives with his wife and teenage children. They have not mentioned concerns.    PAST MEDICAL HISTORY: Past Medical History:  Diagnosis Date   Hypertension     PAST SURGICAL HISTORY: History reviewed. No pertinent surgical history.  MEDICATIONS: Current Outpatient Medications on File Prior to Visit  Medication Sig Dispense Refill   amoxicillin (AMOXIL) 875 MG tablet Take 875 mg by mouth 2 (two) times daily.     apixaban (ELIQUIS) 5 MG TABS tablet Take 1 tablet (5 mg total) by mouth 2 (two) times daily. 180 tablet 3   carvedilol (COREG) 6.25 MG tablet Take 1 tablet (6.25 mg total) by mouth 2 (two) times daily. 120 tablet 5   empagliflozin (JARDIANCE) 25 MG TABS tablet Take 25 mg by mouth.     losartan (COZAAR) 50 MG tablet Take 1 tablet (50 mg total) by mouth daily. 90 tablet 3   No current facility-administered medications on file prior to visit.    ALLERGIES: Allergies  Allergen Reactions   Codeine Rash    Other reaction(s): Eruption of skin    FAMILY HISTORY: Family History  Problem Relation Age of Onset   Aneurysm Mother    Hypertension Mother  Cancer Father 40       Prostate    SOCIAL HISTORY: Social History   Socioeconomic History   Marital status: Married    Spouse name: Not on file   Number of children: 2   Years of education: 16   Highest education level: Not on file  Occupational History   Not on file  Tobacco Use   Smoking status: Never   Smokeless tobacco: Never  Vaping Use   Vaping status: Never Used  Substance and Sexual Activity   Alcohol use: Not Currently   Drug use: Never   Sexual activity: Not on file  Other Topics Concern   Not on  file  Social History Narrative   One floor home   Right handed   Caffeine prn   Retired   Lives with wife and two kids   Social Drivers of Corporate investment banker Strain: Not on file  Food Insecurity: Not on file  Transportation Needs: Not on file  Physical Activity: Not on file  Stress: Not on file  Social Connections: Unknown (02/15/2022)   [facility] from [facility]   Social Network    Social Network: Not on file  Intimate Partner Violence: Unknown (01/07/2022)   Received from [facility]   HITS    Physically Hurt: Not on file    Insult or Talk Down To: Not on file    Threaten Physical Harm: Not on file    Scream or Curse: Not on file     PHYSICAL EXAM: Vitals:   05/17/24 0831  BP: (!) 142/93  Pulse: 81  Resp: 20  SpO2: 100%   General: No acute distress Head:  Normocephalic/atraumatic Skin/Extremities: No rash, no edema Neurological Exam: Mental status: alert and oriented to person, place, and time, no dysarthria or aphasia, Fund of knowledge is appropriate.  Recent and remote memory are intact.  Attention and concentration are normal.    Able to name objects and repeat phrases. MoCa 27/30    05/17/2024    9:00 AM  Montreal Cognitive Assessment   Visuospatial/ Executive (0/5) 5  Naming (0/3) 3  Attention: Read list of digits (0/2) 1  Attention: Read list of letters (0/1) 1  Attention: Serial 7 subtraction starting at 100 (0/3) 3  Language: Repeat phrase (0/2) 2  Language : Fluency (0/1) 1  Abstraction (0/2) 1  Delayed Recall (0/5) 4  Orientation (0/6) 6  Total 27    Cranial nerves: CN I: not tested CN II: pupils equal, round, visual fields intact CN III, IV, VI:  full range of motion, no nystagmus, no ptosis CN V: facial sensation intact CN VII: upper and lower face symmetric CN VIII: hearing intact to conversation CN XI: sternocleidomastoid and trapezius muscles intact CN XII: tongue midline Bulk & Tone: normal, no fasciculations. Motor: 5/5  throughout with no pronator drift. Sensation: intact to light touch, cold, pin, vibration sense.  No extinction to double simultaneous stimulation.  Romberg test negative Deep Tendon Reflexes: +2 throughout Cerebellar: no incoordination on finger to nose testing Gait: narrow-based and steady, able to tandem walk adequately. Tremor: none   IMPRESSION: This is a 68 year old right-handed man with a history of hypertension, PAF, prostate cancer, OSA on CPAP, presenting for evaluation of memory loss. He mostly reports getting distracted but denies any difficulties with complex tasks. Neurological exam normal, MoCA 27/30. TSH and B12 normal. We discussed different causes of memory loss, including anxiety. MRI brain without contrast will be ordered  to assess underlying structural abnormality and assess vascular load. Schedule Neurocognitive testing to further evaluate cognitive concerns. We discussed the importance of control of vascular risk factors, physical and brain stimulation exercises, MIND diet for overall brain health. Our office with call with results and next steps. He knows to call for any changes.    Thank you for allowing me to participate in the care of this patient. Please do not hesitate to call for any questions or concerns.   Darice Shivers, M.D.  CC: Dr. Roxie

## 2024-05-17 NOTE — Patient Instructions (Addendum)
 Good to meet you.  Schedule MRI brain without contrast at [phone]  2. Schedule Neurocognitive testing  3. Here are some activities which have therapeutic value and can be useful in keeping you cognitively stimulated. You can try this website: https://www.[facility].org/get-to-know-barrow/centers-programs/neurorehabilitation-center/neuro-rehab-apps-and-games/ which has options, categorized by level of difficulty.  4. Our office will call with results and next steps, call for any changes   You have been referred for a neurocognitive evaluation in our office.   The evaluation has two parts.   The first part of the evaluation is a clinical interview with the neuropsychologist (Dr. Richie or Dr. Gayland). Please bring someone with you to this appointment if possible, as it is helpful for the doctor to hear from both you and another adult who knows you well.   The second part of the evaluation is testing with the doctor's technician Neal or Luke). The testing includes a variety of tasks- mostly question-and-answer, some paper-and-pencil. There is nothing you need to do to prepare for this appointment, but having a good night's sleep prior to the testing, taking medications as you normally would, and bringing eyeglasses and hearing aids (if you wear them), is advised. Please make sure that you wear a mask to the appointment.  Please note: We have to reserve several hours of the neuropsychologist's time and the psychometrician's time for your evaluation appointment. As such, please note that there is a No-Show fee of $100. If you are unable to attend any of your appointments, please contact our office as soon as possible to reschedule.    RECOMMENDATIONS FOR ALL PATIENTS WITH MEMORY PROBLEMS: 1. Continue to exercise (Recommend 30 minutes of walking everyday, or 3 hours every week) 2. Increase social interactions - continue going to [place] and enjoy social gatherings with friends and family 3.  Eat healthy, avoid fried foods and eat more fruits and vegetables 4. Maintain adequate blood pressure, blood sugar, and blood cholesterol level. Reducing the risk of stroke and cardiovascular disease also helps promoting better memory. 5. Avoid stressful situations. Live a simple life and avoid aggravations. Organize your time and prepare for the next day in anticipation. 6. Sleep well, avoid any interruptions of sleep and avoid any distractions in the bedroom that may interfere with adequate sleep quality 7. Avoid sugar, avoid sweets as there is a strong link between excessive sugar intake, diabetes, and cognitive impairment We discussed the Mediterranean diet, which has been shown to help patients reduce the risk of progressive memory disorders and reduces cardiovascular risk. This includes eating fish, eat fruits and green leafy vegetables, nuts like almonds and hazelnuts, walnuts, and also use olive oil. Avoid fast foods and fried foods as much as possible. Avoid sweets and sugar as sugar use has been linked to worsening of memory function.      Mediterranean Diet  Why follow it? Research shows. Those who follow the Mediterranean diet have a reduced risk of heart disease  The diet is associated with a reduced incidence of Parkinson's and Alzheimer's diseases People following the diet may have longer life expectancies and lower rates of chronic diseases  The Dietary Guidelines for Americans recommends the Mediterranean diet as an eating plan to promote health and prevent disease  What Is the Mediterranean Diet?  Healthy eating plan based on typical foods and recipes of Mediterranean-style cooking The diet is primarily a plant based diet; these foods should make up a majority of meals   Starches - Plant based foods should make up a majority  of meals - They are an important sources of vitamins, minerals, energy, antioxidants, and fiber - Choose whole grains, foods high in fiber and minimally  processed items  - Typical grain sources include wheat, oats, barley, corn, brown rice, bulgar, farro, millet, polenta, couscous  - Various types of beans include chickpeas, lentils, fava beans, black beans, white beans   Fruits  Veggies - Large quantities of antioxidant rich fruits & veggies; 6 or more servings  - Vegetables can be eaten raw or lightly drizzled with oil and cooked  - Vegetables common to the traditional Mediterranean Diet include: artichokes, arugula, beets, broccoli, brussel sprouts, cabbage, carrots, celery, collard greens, cucumbers, eggplant, kale, leeks, lemons, lettuce, mushrooms, okra, onions, peas, peppers, potatoes, pumpkin, radishes, rutabaga, shallots, spinach, sweet potatoes, turnips, zucchini - Fruits common to the Mediterranean Diet include: apples, apricots, avocados, cherries, clementines, dates, figs, grapefruits, grapes, melons, nectarines, oranges, peaches, pears, pomegranates, strawberries, tangerines  Fats - Replace butter and margarine with healthy oils, such as olive oil, canola oil, and tahini  - Limit nuts to no more than a handful a day  - Nuts include walnuts, almonds, pecans, pistachios, pine nuts  - Limit or avoid candied, honey roasted or heavily salted nuts - Olives are central to the [employer] - can be eaten whole or used in a variety of dishes   Meats Protein - Limiting red meat: no more than a few times a month - When eating red meat: choose lean cuts and keep the portion to the size of deck of cards - Eggs: approx. 0 to 4 times a week  - Fish and lean poultry: at least 2 a week  - Healthy protein sources include, chicken, [place], lean beef, lamb - Increase intake of seafood such as tuna, salmon, trout, mackerel, shrimp, scallops - Avoid or limit high fat processed meats such as sausage and bacon  Dairy - Include moderate amounts of low fat dairy products  - Focus on healthy dairy such as fat free yogurt, skim milk, low or reduced fat  cheese - Limit dairy products higher in fat such as whole or 2% milk, cheese, ice cream  Alcohol - Moderate amounts of red wine is ok  - No more than 5 oz daily for women (all ages) and men older than age 58  - No more than 10 oz of wine daily for men younger than 67  Other - Limit sweets and other desserts  - Use herbs and spices instead of salt to flavor foods  - Herbs and spices common to the traditional Mediterranean Diet include: basil, bay leaves, chives, cloves, cumin, fennel, garlic, lavender, marjoram, mint, oregano, parsley, pepper, rosemary, sage, savory, sumac, tarragon, thyme   It's not just a diet, it's a lifestyle:  The Mediterranean diet includes lifestyle factors typical of those in the region  Foods, drinks and meals are best eaten with others and savored Daily physical activity is important for overall good health This could be strenuous exercise like running and aerobics This could also be more leisurely activities such as walking, housework, yard-work, or taking the stairs Moderation is the key; a balanced and healthy diet accommodates most foods and drinks Consider portion sizes and frequency of consumption of certain foods   Meal Ideas & Options:  Breakfast:  Whole wheat toast or whole wheat English muffins with peanut butter & hard boiled egg Steel cut oats topped with apples & cinnamon and skim milk  Fresh fruit: banana, strawberries, melon, berries, peaches  Smoothies: strawberries, bananas, greek yogurt, peanut butter Low fat greek yogurt with blueberries and granola  Egg white omelet with spinach and mushrooms Breakfast couscous: whole wheat couscous, apricots, skim milk, cranberries  Sandwiches:  Hummus and grilled vegetables (peppers, zucchini, squash) on whole wheat bread   Grilled chicken on whole wheat pita with lettuce, tomatoes, cucumbers or tzatziki  [place] salad on whole wheat bread: tuna salad made with greek yogurt, olives, red peppers, capers, green  onions Garlic rosemary lamb pita: lamb sauted with garlic, rosemary, salt & pepper; add lettuce, cucumber, greek yogurt to pita - flavor with lemon juice and black pepper  Seafood:  Mediterranean grilled salmon, seasoned with garlic, basil, parsley, lemon juice and black pepper Shrimp, lemon, and spinach whole-grain pasta salad made with low fat greek yogurt  Seared scallops with lemon orzo  Seared tuna steaks seasoned salt, pepper, coriander topped with tomato mixture of olives, tomatoes, olive oil, minced garlic, parsley, green onions and cappers  Meats:  Herbed greek chicken salad with kalamata olives, cucumber, feta  Red bell peppers stuffed with spinach, bulgur, lean ground beef (or lentils) & topped with feta   Kebabs: skewers of chicken, tomatoes, onions, zucchini, squash  [place] burgers: made with red onions, mint, dill, lemon juice, feta cheese topped with roasted red peppers Vegetarian Cucumber salad: cucumbers, artichoke hearts, celery, red onion, feta cheese, tossed in olive oil & lemon juice  Hummus and whole grain pita points with a greek salad (lettuce, tomato, feta, olives, cucumbers, red onion) Lentil soup with celery, carrots made with vegetable broth, garlic, salt and pepper  Tabouli salad: parsley, bulgur, mint, scallions, cucumbers, tomato, radishes, lemon juice, olive oil, salt and pepper.

## 2024-05-28 ENCOUNTER — Other Ambulatory Visit

## 2024-05-29 ENCOUNTER — Ambulatory Visit (INDEPENDENT_AMBULATORY_CARE_PROVIDER_SITE_OTHER): Admitting: Psychology

## 2024-05-29 ENCOUNTER — Ambulatory Visit: Payer: Self-pay | Admitting: Psychology

## 2024-05-29 DIAGNOSIS — R4189 Other symptoms and signs involving cognitive functions and awareness: Secondary | ICD-10-CM

## 2024-05-29 DIAGNOSIS — F419 Anxiety disorder, unspecified: Secondary | ICD-10-CM

## 2024-05-29 DIAGNOSIS — R419 Unspecified symptoms and signs involving cognitive functions and awareness: Secondary | ICD-10-CM | POA: Diagnosis not present

## 2024-05-29 NOTE — Progress Notes (Unsigned)
 NEUROPSYCHOLOGICAL EVALUATION [place]. [facility]  [place] Department of Neurology  Date of Evaluation: 05/29/2024  REASON FOR REFERRAL   Eric Hardin is a 68 year old, right-handed, Black male with 16 years of formal education. He was referred for neuropsychological evaluation by his neurologist, Darice Shivers, M.D., to assess current neurocognitive functioning, document potential cognitive deficits, and assist with treatment planning. This is his first neuropsychological evaluation.  SUMMARY OF RESULTS   Premorbid cognitive abilities are estimated to be in the average range. Consistent with this baseline estimate, performance today was intact across all domains, including attention/working memory, processing speed, executive functioning, language, visuospatial abilities, learning/memory, and fine motor dexterity. Notably, scores on both tasks of verbal fluency exceeded expectations compared to a normative sample of his peers.   On self-report questionnaires, he did not report clinically significant symptoms of depression or anxiety.  DIAGNOSTIC IMPRESSION   Results of the current evaluation indicated normal cognitive functioning, with no impairments observed in any domain. He does not show evidence of a neurocognitive disorder at this time. His overall profile reflects healthy cognitive aging and well-preserved cognitive and functional abilities. Subjective cognitive concerns may be related to normal aging, ***  Results provide a baseline for future comparison should reevaluation becomes necessary.  ICD-10 Codes: R41.9 Cognitive concerns with normal neuropsychological exam  RECOMMENDATIONS   A  {KDEISSHEALTHYLIVINGRECS:32196}  {KDEISSPSYCHRECS:32202}  {KDEISSSLEEPRECS:32198}  {KDEISSCOGRECS:32216}  DISPOSITION   Patient will follow up with the referring provider, Dr. Shivers. No follow-up neuropsychological testing was scheduled at this time. Please feel free  to refer the patient for repeated evaluation if he shows a significant change in neurocognitive status. He will be provided verbal feedback in approximately one week regarding the findings and impression during this visit.  The remainder of the report includes the details of the patient's background and a table of results from the current evaluation, which support the summary and recommendations described above.  BACKGROUND   History of Presenting Illness: The following information was obtained from a review of medical records and an interview with the patient. Briefly, the patient establish care with Dr. Shivers at [facility] on 05/17/2024 due to memory concerns over the past one to two years. Specific concerns reported include forgetting why he entered a room, occasionally driving past his destination despite knowing where he is going, and forgetting the names of people he has not seen in a while. MoCA = 27/30. He was referred for neuropsychological evaluation accordingly.  Cognitive Functioning: During today's appointment, the patient reiterated cognitive concerns previously discussed with his neurologist. He reported noticing changes over the past couple of years but expressed uncertainty about whether these changes have worsened or if he is simply more aware of them. He described becoming distracted while walking between rooms or while driving, sometimes missing his intended destination or defaulting to familiar routes unintentionally. He also reported occasional word-finding difficulties, leading him to simplify his vocabulary, and increased difficulty recalling names of people he does not see often. He denied significant memory impairment for information he considers important and reported no major concerns with executive functioning. He stated he remains organized, utilizes a calendar effectively, and feels confident in his ability to plan and manage daily tasks.  Physical Functioning:  Patient denied difficulties with sleep initiation and maintenance. Appetite is stable. No changes to sense of taste or smell were reported. Vision and hearing are stable. ***RBD, falls, tremor, speech, incontinence, pain***  Emotional Functioning: Patient reported his current mood as ***. ***  Imaging: MRI of the brain is scheduled for 06/05/2024.  Other Relevant Medical History: Remarkable for hypertension, atrial fibrillation, sleep apnea, and prostate cancer. Please refer to the medical record for a more comprehensive problem list. No history of stroke, CNS infection, head injury, or seizure was reported.  Current Medications: Per patient, apixaban , carvedilol , doxycycline, empagliflozin, losartan , and vitamin D.   Functional Status: Patient independently performs all basic and instrumental activities of daily living without difficulty. He continues to drive without any reported accidents, traffic violations, or navigation difficulties. Most of his bills are set to automatic payment, and he manages his medications well with a pillbox.   Family Neurological History: Remarkable for memory loss in both a paternal aunt and a maternal aunt in their 75s.  Psychiatric History: Remarkable for anxiety, although he reported managing well without treatment. He is not currently in therapy but acknowledged participating in counseling in the past. History of depression, suicidal ideation, hallucinations, and psychiatric hospitalizations was not reported.  Substance Use History: Patient denied past and present use of alcohol, nicotine, marijuana, and other illicit substances.  Social and Developmental History: Patient was born in [place], [place]. History of perinatal complications and developmental delays was not reported. He is married and has eight children (i.e., three biological, five step). He currently resides with his wife and two of their children.  Educational and Occupational History: No history of  childhood learning disability, special education services, or grade retention was reported. Patient earned a bachelor's degree in business administration. 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 He is retired.   BEHAVIORAL OBSERVATIONS   Patient arrived on time and was unaccompanied. He ambulated independently and without gait disturbance. He was alert and oriented with the exception of the exact date. He was appropriately groomed and dressed for the setting. No significant sensory or motor abnormalities were observed. Vision and hearing were adequate for testing purposes. Speech was of normal rate, prosody, and volume. No conversational word-finding difficulties, paraphasic errors, or dysarthria were observed. Comprehension was conversationally intact. Thought processes were linear, logical, and coherent. Thought content was organized and devoid of delusions. Insight appeared appropriate. Affect was even and congruent with euthymic mood. He was cooperative and gave adequate effort during testing, including on standalone and embedded measures of performance validity. Results are thought to accurately reflect his cognitive functioning at this time.  NEUROPSYCHOLOGICAL TESTING RESULTS   Tests Administered: Animal Naming Test; Brief Visuospatial Memory Test-Revised (BVMT-R) - Form 1; Controlled Oral Word Association Test (COWAT): FAS; Delis-Kaplan Executive Function System (D-KEFS) - Subtest(s): Color-Word Interference Test; Geriatric Anxiety Scale-10 Item (GAS-10); Geriatric Depression Scale Short Form (GDS-SF); Grooved Pegboard Test; [employer] Verbal Learning Test Revised (HVLT-R) - From 1; Judgment of Line Orientation (JLO) - Form H; Neuropsychological Assessment Battery (NAB) - Subtest(s): Naming Form 1; Standalone performance validity test (PVT); Trail Making Test (TMT); Wechsler Adult Intelligence Scale Fifth Edition  (WAIS-5) - Subtest(s): Similarities, Clinical cytogeneticist, Matrix Reasoning, Digit Sequencing, Coding, Running Digits, Symbol Search, Symbol Span; Wechsler Memory Scale Fourth Edition (WMS-IV) - Subtest(s): Logical Memory (LM); and Wide Range Achievement Test Fourth Edition (WRAT4) - Subtest(s): Word Reading.  Test results are provided in the table below. Whenever possible, the patient's scores were compared against age-, sex-, and education-corrected normative samples. Interpretive descriptions are based on the AACN consensus conference statement on uniform labeling (Guilmette et al., 2020).  PREMORBID FUNCTIONING RAW  RANGE  WRAT4 Word Reading  StdS=96 Average  ATTENTION & WORKING MEMORY RAW  RANGE  WAIS-5 Digit Sequencing -- ss=7 Low Average  WAIS-5 Running Digits -- ss=8 Average  WAIS-5 Symbol Span -- ss=9 Average  PROCESSING SPEED RAW  RANGE  Trails A 46''1e T=43 Average  WAIS-5 Coding  -- ss=8 Average  WAIS-5 Symbol Search -- ss=7 Low Average  DKEFS CWIT Color Naming 32''1e ss=10 Average  DKEFS CWIT Word Reading 21''0e ss=12 High Average  EXECUTIVE FUNCTION RAW  RANGE  Trails B 89''0e T=52 Average  WAIS-5 Matrix Reasoning -- ss=8 Average  WAIS-5 Similarities -- ss=12 High Average  COWAT Letter Fluency 23+15+24 T=70 Exceptionally High  DKEFS CWIT Inhibition 91''1e ss=6 Low Average  DKEFS CWIT Inhibition/Switching 73''1e ss=11 Average  LANGUAGE RAW  RANGE  COWAT Letter Fluency 23+15+24 T=70 Exceptionally High  Animal Naming Test 26 T=66 Above Average  NAB Naming Test 31/31 T=55 WNL  VISUOSPATIAL RAW  RANGE  WAIS-5 Block Design -- ss=9 Average  JLO C/S=31/30 86+%ile WNL  BVMT-R Copy Trial 11/12 -- WNL  VERBAL LEARNING & MEMORY RAW  RANGE  HVLT Learning Trials (7+9+10)/36 T=49 Average  HVLT Delayed Recall 8/12 T=42 Low Average  HVLT Recognition Hits 12 -- --  HVLT Recognition False Positives 1 -- --  HVLT Discrimination Index 11 T=52 Average  WMS-IV LM-I  (10+13+15)/53 ss=12 High  Average  WMS-IV LM-II  (13+16)/39 ss=14 High Average  WMS-IV LM Recognition  (7+14)/23 51-75%ile Average  VISUAL LEARNING & MEMORY RAW  RANGE  BVMT-R Total Recall (5+8+10)/36 T=53 Average  BVMT-R Delayed Recall 8/12 T=49 Average  BVMT-R Recognition Hits 6 >16%ile WNL  BVMT-R Recognition False Alarms 0 >16%ile WNL  BVMT-R Recognition Discrimination Index 6 >16%ile WNL  FINE MOTOR DEXTERITY RAW  RANGE  Grooved Pegboard (Dominant Hand) 82''1d T=48 Average  Grooved Pegboard (Non-Dominant Hand) 88''0d T=53 Average  QUESTIONNAIRES RAW  RANGE  GDS-SF 1 -- Minimal  GAS-10 1 -- Minimal  *Note: ss = scaled score; StdS = standard score; T = t-score; C/S = corrected raw score; WNL = within normal limits; BNL= below normal limits; D/C = discontinued. Scores from skewed distributions are typically interpreted as WNL (>=16th %ile) or BNL (<16th %ile).   INFORMED CONSENT   Patient was provided with a verbal description of the nature and purpose of the neuropsychological evaluation. Also reviewed were the foreseeable risks and/or discomforts and benefits of the procedure, limits of confidentiality, and mandatory reporting requirements of this provider. Patient was given the opportunity to have their questions answered. Oral consent to participate was provided by the patient.   This report was prepared as part of a clinical evaluation and is not intended for forensic use.  SERVICE   This evaluation was conducted by Renda Beckwith, Psy.D. In addition to time spent directly with the patient, total professional time (120 minutes) includes record review, integration of relevant medical history, test selection, interpretation of findings, and report preparation. A technician, Lonell Jude, B.S., provided testing and scoring assistance (120 minutes).  Psychiatric Diagnostic Evaluation Services (Professional): 09208 x 1 Neuropsychological Testing Evaluation Services (Professional): 03867 x  1 Neuropsychological Testing Evaluation Services (Professional): 03866 x 1 Neuropsychological Test Administration and Scoring Radiographer, therapeutic): [phone] x 1 Neuropsychological Test Administration and Scoring (Technician): [phone] x 3  This report was generated using voice recognition software. While this document has been carefully reviewed, transcription errors may be present. I apologize in advance for any inconvenience. Please contact me if further clarification is needed.  Renda Beckwith, Psy.D.             Neuropsychologist

## 2024-05-29 NOTE — Progress Notes (Signed)
   Psychometrician Note   Cognitive testing was administered to Eric Hardin by Lonell Jude, B.S. (psychometrist) under the supervision of Dr. Renda Beckwith, Psy.D., licensed psychologist on 05/29/2024. Eric Hardin did not appear overtly distressed by the testing session per behavioral observation or responses across self-report questionnaires. Rest breaks were offered.    The battery of tests administered was selected by Dr. Renda Beckwith, Psy.D. with consideration to Eric Hardin current level of functioning, the nature of his symptoms, emotional and behavioral responses during interview, level of literacy, observed level of motivation/effort, and the nature of the referral question. This battery was communicated to the psychometrist. Communication between Dr. Renda Beckwith, Psy.D. and the psychometrist was ongoing throughout the evaluation and Dr. Renda Beckwith, Psy.D. was immediately accessible at all times. Dr. Renda Beckwith, Psy.D. provided supervision to the psychometrist on the date of this service to the extent necessary to assure the quality of all services provided.    Eric Hardin will return within approximately 1-2 weeks for an interactive feedback session with Dr. Beckwith at which time his test performances, clinical impressions, and treatment recommendations will be reviewed in detail. Eric Hardin understands he can contact our office should he require our assistance before this time.  A total of 120 minutes of billable time were spent face-to-face with Eric Hardin by the psychometrist. This includes both test administration and scoring time. Billing for these services is reflected in the clinical report generated by Dr. Renda Beckwith, Psy.D.  This note reflects time spent with the psychometrician and does not include test scores or any clinical interpretations made by Dr. Beckwith. The full report will follow in a separate note.

## 2024-05-30 ENCOUNTER — Encounter: Payer: Self-pay | Admitting: Neurology

## 2024-06-05 ENCOUNTER — Ambulatory Visit
Admission: RE | Admit: 2024-06-05 | Discharge: 2024-06-05 | Disposition: A | Discharge status: Home / Self Care | Source: Ambulatory Visit | Attending: Neurology

## 2024-06-05 ENCOUNTER — Ambulatory Visit: Payer: Self-pay | Admitting: Neurology

## 2024-06-07 ENCOUNTER — Ambulatory Visit (INDEPENDENT_AMBULATORY_CARE_PROVIDER_SITE_OTHER): Admitting: Psychology

## 2024-06-07 DIAGNOSIS — R419 Unspecified symptoms and signs involving cognitive functions and awareness: Secondary | ICD-10-CM

## 2024-06-07 DIAGNOSIS — F419 Anxiety disorder, unspecified: Secondary | ICD-10-CM

## 2024-06-07 NOTE — Progress Notes (Signed)
   NEUROPSYCHOLOGY FEEDBACK SESSION [place]. [facility]  [place] Department of Neurology  Date of Feedback Session: 06/07/2024  REASON FOR REFERRAL   Eric Hardin is a 68 year old, right-handed, Black male with 16 years of formal education. He was referred for neuropsychological evaluation by his neurologist, Darice Shivers, M.D., to assess current neurocognitive functioning, document potential cognitive deficits, and assist with treatment planning. This is his first neuropsychological evaluation.  FEEDBACK   Patient completed a comprehensive neuropsychological evaluation on 05/29/2024. Please refer to that encounter for the full report and recommendations. Briefly, results indicated normal cognitive functioning, with no impairments observed in any domain. He does not show evidence of a neurocognitive disorder at this time. His overall profile reflects healthy cognitive aging and well-preserved cognitive and functional abilities. Subjective cognitive concerns may be related to normal aging and reported anxiety.  Today, the patient was unaccompanied. He was provided verbal feedback regarding the findings and impression during this visit, and his questions were answered. A copy of the report was provided at the conclusion of the visit.  DISPOSITION   Patient will follow up with the referring provider, Dr. Shivers. No follow-up neuropsychological testing was scheduled at this time. Please feel free to refer the patient for repeated evaluation if he shows a significant change in neurocognitive status.  SERVICE   This feedback session was conducted by Renda Beckwith, Psy.D. Given that the time spent preparing, conducting, and documenting the current feedback session totaled only 20 minutes, no billing was submitted.  This report was generated using voice recognition software. While this document has been carefully reviewed, transcription errors may be present. I apologize in advance  for any inconvenience. Please contact me if further clarification is needed.

## 2024-06-28 ENCOUNTER — Ambulatory Visit: Admitting: Neurology

## 2024-10-11 ENCOUNTER — Telehealth: Payer: Self-pay

## 2024-10-11 ENCOUNTER — Other Ambulatory Visit: Payer: Self-pay

## 2024-10-11 DIAGNOSIS — I10 Essential (primary) hypertension: Secondary | ICD-10-CM

## 2024-10-11 DIAGNOSIS — E782 Mixed hyperlipidemia: Secondary | ICD-10-CM

## 2024-10-11 DIAGNOSIS — Z79899 Other long term (current) drug therapy: Secondary | ICD-10-CM

## 2024-10-11 NOTE — Telephone Encounter (Signed)
 Left message to call back.  Per Dr Sheena - She would like pt to get CBC, CMP before appointment with her on Tuesday. Labs have been ordered and released.

## 2024-10-14 ENCOUNTER — Encounter: Payer: Self-pay | Admitting: *Deleted

## 2024-10-14 LAB — CBC
Hematocrit: 50.8 % (ref 37.5–51.0)
Hemoglobin: 16.7 g/dL (ref 13.0–17.7)
MCH: 30.7 pg (ref 26.6–33.0)
MCHC: 32.9 g/dL (ref 31.5–35.7)
MCV: 93 fL (ref 79–97)
Platelets: 184 x10E3/uL (ref 150–450)
RBC: 5.44 x10E6/uL (ref 4.14–5.80)
RDW: 13.8 % (ref 11.6–15.4)
WBC: 4.6 x10E3/uL (ref 3.4–10.8)

## 2024-10-14 NOTE — Telephone Encounter (Signed)
 Patient returned call.

## 2024-10-14 NOTE — Telephone Encounter (Signed)
 Appointment is 1/13, it is too late for lab work prior at this point.

## 2024-10-14 NOTE — Telephone Encounter (Signed)
 Left message to call back.

## 2024-10-15 ENCOUNTER — Encounter: Payer: Self-pay | Admitting: Cardiology

## 2024-10-15 ENCOUNTER — Ambulatory Visit

## 2024-10-15 ENCOUNTER — Ambulatory Visit: Attending: Cardiology | Admitting: Cardiology

## 2024-10-15 VITALS — BP 122/78 | HR 71 | Ht 69.0 in | Wt 172.0 lb

## 2024-10-15 DIAGNOSIS — I719 Aortic aneurysm of unspecified site, without rupture: Secondary | ICD-10-CM

## 2024-10-15 DIAGNOSIS — N183 Chronic kidney disease, stage 3 unspecified: Secondary | ICD-10-CM

## 2024-10-15 DIAGNOSIS — I480 Paroxysmal atrial fibrillation: Secondary | ICD-10-CM

## 2024-10-15 DIAGNOSIS — I10 Essential (primary) hypertension: Secondary | ICD-10-CM

## 2024-10-15 LAB — COMPREHENSIVE METABOLIC PANEL WITH GFR
ALT: 18 IU/L (ref 0–44)
AST: 22 IU/L (ref 0–40)
Albumin: 4.1 g/dL (ref 3.9–4.9)
Alkaline Phosphatase: 74 IU/L (ref 47–123)
BUN/Creatinine Ratio: 9 — ABNORMAL LOW (ref 10–24)
BUN: 14 mg/dL (ref 8–27)
Bilirubin Total: 1.2 mg/dL (ref 0.0–1.2)
CO2: 27 mmol/L (ref 20–29)
Calcium: 9.4 mg/dL (ref 8.6–10.2)
Chloride: 104 mmol/L (ref 96–106)
Creatinine, Ser: 1.54 mg/dL — ABNORMAL HIGH (ref 0.76–1.27)
Globulin, Total: 2.7 g/dL (ref 1.5–4.5)
Glucose: 63 mg/dL — ABNORMAL LOW (ref 70–99)
Potassium: 4.6 mmol/L (ref 3.5–5.2)
Sodium: 143 mmol/L (ref 134–144)
Total Protein: 6.8 g/dL (ref 6.0–8.5)
eGFR: 49 mL/min/1.73 — ABNORMAL LOW

## 2024-10-15 LAB — MAGNESIUM: Magnesium: 2.2 mg/dL (ref 1.6–2.3)

## 2024-10-15 NOTE — Patient Instructions (Signed)
 Medication Instructions:  Your physician recommends that you continue on your current medications as directed. Please refer to the Current Medication list given to you today.  *If you need a refill on your cardiac medications before your next appointment, please call your pharmacy*  Please take your blood pressure daily if your systolic blood pressure (top number) is less than 110 or greater than 130. Please notify the office.   HOW TO TAKE YOUR BLOOD PRESSURE: Rest 5 minutes before taking your blood pressure. Dont smoke or drink caffeinated beverages for at least 30 minutes before. Take your blood pressure before (not after) you eat. Sit comfortably with your back supported and both feet on the floor (dont cross your legs). Elevate your arm to heart level on a table or a desk. Use the proper sized cuff. It should fit smoothly and snugly around your bare upper arm. There should be enough room to slip a fingertip under the cuff. The bottom edge of the cuff should be 1 inch above the crease of the elbow. Ideally, take 3 measurements at one sitting and record the average.  Blood Pressure Log Date/Time Medications taken? (Y/N) Blood Pressure Heart Rate/Pulse                                                                                                                                                                                          Testing/Procedures: Your physician has requested that you have an echocardiogram. Echocardiography is a painless test that uses sound waves to create images of your heart. It provides your doctor with information about the size and shape of your heart and how well your hearts chambers and valves are working. This procedure takes approximately one hour. There are no restrictions for this procedure. Please do NOT wear cologne, perfume, aftershave, or lotions (deodorant is allowed). Please arrive 15 minutes prior to your appointment  time.  Please note: We ask at that you not bring children with you during ultrasound (echo/ vascular) testing. Due to room size and safety concerns, children are not allowed in the ultrasound rooms during exams. Our front office staff cannot provide observation of children in our lobby area while testing is being conducted. An adult accompanying a patient to their appointment will only be allowed in the ultrasound room at the discretion of the ultrasound technician under special circumstances. We apologize for any inconvenience.  ZIO XT- Long Term Monitor Instructions  Your physician has requested you wear a ZIO patch monitor for 14 days.  This is a single patch monitor. [employer] supplies one patch monitor per enrollment. Additional stickers are not available. Please do not apply patch  if you will be having a Nuclear Stress Test,  Echocardiogram, Cardiac CT, MRI, or Chest Xray during the period you would be wearing the  monitor. The patch cannot be worn during these tests. You cannot remove and re-apply the  ZIO XT patch monitor.  Your ZIO patch monitor will be mailed 3 day USPS to your address on file. It may take 3-5 days  to receive your monitor after you have been enrolled.  Once you have received your monitor, please review the enclosed instructions. Your monitor  has already been registered assigning a specific monitor serial # to you.  Billing and Patient Assistance Program Information  We have supplied [employer] with any of your insurance information on file for billing purposes. [employer] offers a sliding scale Patient Assistance Program for patients that do not have  insurance, or whose insurance does not completely cover the cost of the ZIO monitor.  You must apply for the Patient Assistance Program to qualify for this discounted rate.  To apply, please call [employer] at [phone], select option 4, select option 2, ask to apply for  Patient Assistance Program. Meredeth will ask your  household income, and how many people  are in your household. They will quote your out-of-pocket cost based on that information.  [employer] will also be able to set up a 67-month, interest-free payment plan if needed.  Applying the monitor   Shave hair from upper left chest.  Hold abrader disc by orange tab. Rub abrader in 40 strokes over the upper left chest as  indicated in your monitor instructions.  Clean area with 4 enclosed alcohol pads. Let dry.  Apply patch as indicated in monitor instructions. Patch will be placed under collarbone on left  side of chest with arrow pointing upward.  Rub patch adhesive wings for 2 minutes. Remove white label marked 1. Remove the white  label marked 2. Rub patch adhesive wings for 2 additional minutes.  While looking in a mirror, press and release button in center of patch. A small green light will  flash 3-4 times. This will be your only indicator that the monitor has been turned on.  Do not shower for the first 24 hours. You may shower after the first 24 hours.  Press the button if you feel a symptom. You will hear a small click. Record Date, Time and  Symptom in the Patient Logbook.  When you are ready to remove the patch, follow instructions on the last 2 pages of Patient  Logbook. Stick patch monitor onto the last page of Patient Logbook.  Place Patient Logbook in the blue and white box. Use locking tab on box and tape box closed  securely. The blue and white box has prepaid postage on it. Please place it in the mailbox as  soon as possible. Your physician should have your test results approximately 7 days after the  monitor has been mailed back to [facility].  Call [facility] Customer Care at [phone] if you have questions regarding  your ZIO XT patch monitor. Call them immediately if you see an orange light blinking on your  monitor.  If your monitor falls off in less than 4 days, contact our Monitor department at [phone].   If your monitor becomes loose or falls off after 4 days call [employer] at [phone] for  suggestions on securing your monitor   Follow-Up: At [facility], you and your health needs are our priority.  As part of our continuing mission to  provide you with exceptional heart care, our providers are all part of one team.  This team includes your primary Cardiologist (physician) and Advanced Practice Providers or APPs (Physician Assistants and Nurse Practitioners) who all work together to provide you with the care you need, when you need it.  Your next appointment:   16 week(s)  Provider:   Kardie Tobb, DO    Other Instructions:

## 2024-10-15 NOTE — Progress Notes (Signed)
 " Cardiology Office Note:    Date:  10/15/2024   ID:  Eric Hardin, DOB 11/07/1955, MRN [id number]  PCP:  Roxie Carrier, MD  Cardiologist:  Dub Huntsman, DO  Electrophysiologist:  None   Referring MD: Roxie Carrier, MD    I have had some A-fib episodes  History of Present Illness:    Eric Hardin is a 69 y.o. male with a hx of paroxysmal atrial fibrillation on eliquis  and Coreg , hypertension, CKD stage 3 here today for a follow up visit   Since his last visit with me he has been doing well from a cardiovascular standpoint.  But recently he reports that he has had some intermittent paroxysmal atrial fibrillation episodes.  During this time he feels his blood pressure fluctuates as well.  Thankfully he has not passed out.  He has had some fatigue and shortness of breath associated with the A-fib episodes.  Past Medical History:  Diagnosis Date   Hypertension     No past surgical history on file.  Current Medications: Current Meds  Medication Sig   apixaban  (ELIQUIS ) 5 MG TABS tablet Take 1 tablet (5 mg total) by mouth 2 (two) times daily.   carvedilol  (COREG ) 6.25 MG tablet Take 1 tablet (6.25 mg total) by mouth 2 (two) times daily.   Cholecalciferol 50 MCG (2000 UT) TABS Take 50 mcg by mouth daily.   empagliflozin (JARDIANCE) 25 MG TABS tablet Take 25 mg by mouth.   losartan  (COZAAR ) 50 MG tablet Take 1 tablet (50 mg total) by mouth daily.     Allergies:   Codeine   Social History   Socioeconomic History   Marital status: Married    Spouse name: Not on file   Number of children: 2   Years of education: 16   Highest education level: Not on file  Occupational History   Not on file  Tobacco Use   Smoking status: Never   Smokeless tobacco: Never  Vaping Use   Vaping status: Never Used  Substance and Sexual Activity   Alcohol use: Not Currently   Drug use: Never   Sexual activity: Not on file  Other Topics Concern   Not on file  Social History  Narrative   One floor home   Right handed   Caffeine prn   Retired   Lives with wife and two kids   Social Drivers of Health   Tobacco Use: Low Risk (10/15/2024)   Patient History    Smoking Tobacco Use: Never    Smokeless Tobacco Use: Never    Passive Exposure: Not on file  Financial Resource Strain: Not on file  Food Insecurity: Not on file  Transportation Needs: Not on file  Physical Activity: Not on file  Stress: Not on file  Social Connections: Unknown (02/15/2022)   Received from [facility]   Social Network    Social Network: Not on file  Depression (PHQ2-9): Not on file  Alcohol Screen: Not on file  Housing: Not on file  Utilities: Not on file  Health Literacy: Not on file     Family History: The patient's family history includes Aneurysm in his mother; Cancer (age of onset: 1) in his father; Hypertension in his mother.  ROS:   Review of Systems  Constitution: Negative for decreased appetite, fever and weight gain.  HENT: Negative for congestion, ear discharge, hoarse voice and sore throat.   Eyes: Negative for discharge, redness, vision loss in right eye and visual halos.  Cardiovascular: Negative for  chest pain, dyspnea on exertion, leg swelling, orthopnea and palpitations.  Respiratory: Negative for cough, hemoptysis, shortness of breath and snoring.   Endocrine: Negative for heat intolerance and polyphagia.  Hematologic/Lymphatic: Negative for bleeding problem. Does not bruise/bleed easily.  Skin: Negative for flushing, nail changes, rash and suspicious lesions.  Musculoskeletal: Negative for arthritis, joint pain, muscle cramps, myalgias, neck pain and stiffness.  Gastrointestinal: Negative for abdominal pain, bowel incontinence, diarrhea and excessive appetite.  Genitourinary: Negative for decreased libido, genital sores and incomplete emptying.  Neurological: Negative for brief paralysis, focal weakness, headaches and loss of balance.   Psychiatric/Behavioral: Negative for altered mental status, depression and suicidal ideas.  Allergic/Immunologic: Negative for HIV exposure and persistent infections.    EKGs/Labs/Other Studies Reviewed:    The following studies were reviewed today:   EKG:  The ekg ordered today demonstrates   Recent Labs: 10/14/2024: ALT 18; BUN 14; Creatinine, Ser 1.54; Hemoglobin 16.7; Magnesium 2.2; Platelets 184; Potassium 4.6; Sodium 143  Recent Lipid Panel No results found for: CHOL, TRIG, HDL, CHOLHDL, VLDL, LDLCALC, LDLDIRECT  Physical Exam:    VS:  BP 122/78 (BP Location: Left Arm, Patient Position: Sitting, Cuff Size: Normal)   Pulse 71   Ht 5' 9 (1.753 m)   Wt 172 lb (78 kg)   SpO2 97%   BMI 25.40 kg/m     Wt Readings from Last 3 Encounters:  10/15/24 172 lb (78 kg)  05/17/24 179 lb (81.2 kg)  08/18/23 179 lb 12.8 oz (81.6 kg)     GEN: Well nourished, well developed in no acute distress HEENT: Normal NECK: No JVD; No carotid bruits LYMPHATICS: No lymphadenopathy CARDIAC: S1S2 noted,RRR, no murmurs, rubs, gallops RESPIRATORY:  Clear to auscultation without rales, wheezing or rhonchi  ABDOMEN: Soft, non-tender, non-distended, +bowel sounds, no guarding. EXTREMITIES: No edema, No cyanosis, no clubbing MUSCULOSKELETAL:  No deformity  SKIN: Warm and dry NEUROLOGIC:  Alert and oriented x 3, non-focal PSYCHIATRIC:  Normal affect, good insight  ASSESSMENT:    1. Primary hypertension   2. PAF (paroxysmal atrial fibrillation) (HCC)   3. Aortic aneurysm without rupture, unspecified portion of aorta   4. Stage 3 chronic kidney disease, unspecified whether stage 3a or 3b CKD (HCC)    PLAN:     Hypertension-blood pressure is at target.  But with the fluctuation of asked the patient to take his blood pressure daily if he notices systolic blood pressure less than 110 to notify my office or greater than 130.  Will make adjustments as appropriate.   PAF - in sinus  rhythm today continue eliquis  for stroke prevention, Chadvasc 2 score is 2. Continue Coreg  6.125 mg twice daily.  I am also going to place a monitor on the patient to assess for atrial fibrillation burden.  In addition an echocardiogram will be repeated given the shortness of breath and sometimes fatigue in the setting of the A-fib.  If he has any A-fib burden associated with this my plan will be to start flecainide on the patient giving if there are no structural abnormalities on his echocardiogram.  He has had a coronary CTA with no evidence of coronary artery disease.   Aortic dilatation on the CCTA was 41 mm -he needs repeat imaging to reassess this.  An echocardiogram has been ordered. While there are no published guideline the common concensus advise:<4.4cm lift no more than 75 to 100 lbs  CKD stage 2- avoid nephro toxins  Follow-up 16 weeks  I reviewed his blood  work with him today creatinine, blood glucose 63 which is slightly low shared this with the patient stable no other concerns at this time.  The patient is in agreement with the above plan. The patient left the office in stable condition.  The patient will follow up in 16 weeks year.   Medication Adjustments/Labs and Tests Ordered: Current medicines are reviewed at length with the patient today.  Concerns regarding medicines are outlined above.  Orders Placed This Encounter  Procedures   LONG TERM MONITOR (3-14 DAYS)   EKG 12-Lead   ECHOCARDIOGRAM COMPLETE   No orders of the defined types were placed in this encounter.   Patient Instructions  Medication Instructions:  Your physician recommends that you continue on your current medications as directed. Please refer to the Current Medication list given to you today.  *If you need a refill on your cardiac medications before your next appointment, please call your pharmacy*  Please take your blood pressure daily if your systolic blood pressure (top number) is less than 110 or  greater than 130. Please notify the office.   HOW TO TAKE YOUR BLOOD PRESSURE: Rest 5 minutes before taking your blood pressure. Dont smoke or drink caffeinated beverages for at least 30 minutes before. Take your blood pressure before (not after) you eat. Sit comfortably with your back supported and both feet on the floor (dont cross your legs). Elevate your arm to heart level on a table or a desk. Use the proper sized cuff. It should fit smoothly and snugly around your bare upper arm. There should be enough room to slip a fingertip under the cuff. The bottom edge of the cuff should be 1 inch above the crease of the elbow. Ideally, take 3 measurements at one sitting and record the average.  Blood Pressure Log Date/Time Medications taken? (Y/N) Blood Pressure Heart Rate/Pulse                                                                                                                                                                                          Testing/Procedures: Your physician has requested that you have an echocardiogram. Echocardiography is a painless test that uses sound waves to create images of your heart. It provides your doctor with information about the size and shape of your heart and how well your hearts chambers and valves are working. This procedure takes approximately one hour. There are no restrictions for this procedure. Please do NOT wear cologne, perfume, aftershave, or lotions (deodorant is allowed). Please arrive 15 minutes prior to your appointment time.  Please note: We ask at that you not bring children with you  during ultrasound (echo/ vascular) testing. Due to room size and safety concerns, children are not allowed in the ultrasound rooms during exams. Our front office staff cannot provide observation of children in our lobby area while testing is being conducted. An adult accompanying a patient to their appointment will only be  allowed in the ultrasound room at the discretion of the ultrasound technician under special circumstances. We apologize for any inconvenience.  ZIO XT- Long Term Monitor Instructions  Your physician has requested you wear a ZIO patch monitor for 14 days.  This is a single patch monitor. [employer] supplies one patch monitor per enrollment. Additional stickers are not available. Please do not apply patch if you will be having a Nuclear Stress Test,  Echocardiogram, Cardiac CT, MRI, or Chest Xray during the period you would be wearing the  monitor. The patch cannot be worn during these tests. You cannot remove and re-apply the  ZIO XT patch monitor.  Your ZIO patch monitor will be mailed 3 day USPS to your address on file. It may take 3-5 days  to receive your monitor after you have been enrolled.  Once you have received your monitor, please review the enclosed instructions. Your monitor  has already been registered assigning a specific monitor serial # to you.  Billing and Patient Assistance Program Information  We have supplied [employer] with any of your insurance information on file for billing purposes. [employer] offers a sliding scale Patient Assistance Program for patients that do not have  insurance, or whose insurance does not completely cover the cost of the ZIO monitor.  You must apply for the Patient Assistance Program to qualify for this discounted rate.  To apply, please call [employer] at [phone], select option 4, select option 2, ask to apply for  Patient Assistance Program. Meredeth will ask your household income, and how many people  are in your household. They will quote your out-of-pocket cost based on that information.  [employer] will also be able to set up a 82-month, interest-free payment plan if needed.  Applying the monitor   Shave hair from upper left chest.  Hold abrader disc by orange tab. Rub abrader in 40 strokes over the upper left chest as  indicated in your monitor  instructions.  Clean area with 4 enclosed alcohol pads. Let dry.  Apply patch as indicated in monitor instructions. Patch will be placed under collarbone on left  side of chest with arrow pointing upward.  Rub patch adhesive wings for 2 minutes. Remove white label marked 1. Remove the white  label marked 2. Rub patch adhesive wings for 2 additional minutes.  While looking in a mirror, press and release button in center of patch. A small green light will  flash 3-4 times. This will be your only indicator that the monitor has been turned on.  Do not shower for the first 24 hours. You may shower after the first 24 hours.  Press the button if you feel a symptom. You will hear a small click. Record Date, Time and  Symptom in the Patient Logbook.  When you are ready to remove the patch, follow instructions on the last 2 pages of Patient  Logbook. Stick patch monitor onto the last page of Patient Logbook.  Place Patient Logbook in the blue and white box. Use locking tab on box and tape box closed  securely. The blue and white box has prepaid postage on it. Please place it in the mailbox as  soon as possible.  Your physician should have your test results approximately 7 days after the  monitor has been mailed back to [facility].  Call [facility] Customer Care at [phone] if you have questions regarding  your ZIO XT patch monitor. Call them immediately if you see an orange light blinking on your  monitor.  If your monitor falls off in less than 4 days, contact our Monitor department at [phone].  If your monitor becomes loose or falls off after 4 days call [employer] at [phone] for  suggestions on securing your monitor   Follow-Up: At [facility], you and your health needs are our priority.  As part of our continuing mission to provide you with exceptional heart care, our providers are all part of one team.  This team includes your primary Cardiologist (physician)  and Advanced Practice Providers or APPs (Physician Assistants and Nurse Practitioners) who all work together to provide you with the care you need, when you need it.  Your next appointment:   16 week(s)  Provider:   Kamori Barbier, DO    Other Instructions:            Adopting a Healthy Lifestyle.  Know what a healthy weight is for you (roughly BMI <25) and aim to maintain this   Aim for 7+ servings of fruits and vegetables daily   65-80+ fluid ounces of water or unsweet tea for healthy kidneys   Limit to max 1 drink of alcohol per day; avoid smoking/tobacco   Limit animal fats in diet for cholesterol and heart health - choose grass fed whenever available   Avoid highly processed foods, and foods high in saturated/trans fats   Aim for low stress - take time to unwind and care for your mental health   Aim for 150 min of moderate intensity exercise weekly for heart health, and weights twice weekly for bone health   Aim for 7-9 hours of sleep daily   When it comes to diets, agreement about the perfect plan isnt easy to find, even among the experts. Experts at the [facility] [employer] developed an idea known as the Healthy Eating Plate. Just imagine a plate divided into logical, healthy portions.   The emphasis is on diet quality:   Load up on vegetables and fruits - one-half of your plate: Aim for color and variety, and remember that potatoes dont count.   Go for whole grains - one-quarter of your plate: Whole wheat, barley, wheat berries, quinoa, oats, brown rice, and foods made with them. If you want pasta, go with whole wheat pasta.   Protein power - one-quarter of your plate: Fish, chicken, beans, and nuts are all healthy, versatile protein sources. Limit red meat.   The diet, however, does go beyond the plate, offering a few other suggestions.   Use healthy plant oils, such as olive, canola, soy, corn, sunflower and peanut. Check the labels, and avoid  partially hydrogenated oil, which have unhealthy trans fats.   If youre thirsty, drink water. Coffee and tea are good in moderation, but skip sugary drinks and limit milk and dairy products to one or two daily servings.   The type of carbohydrate in the diet is more important than the amount. Some sources of carbohydrates, such as vegetables, fruits, whole grains, and beans-are healthier than others.   Finally, stay active  Signed, Tenesha Garza, DO  10/15/2024 11:13 AM    [facility] "

## 2024-10-15 NOTE — Progress Notes (Unsigned)
 [id number] from office inventory applied to patient.

## 2024-11-19 ENCOUNTER — Ambulatory Visit (HOSPITAL_COMMUNITY)
